# Patient Record
Sex: Female | Born: 1970
Health system: Southern US, Community
[De-identification: ages and names within clinical notes are randomized; demographics above are authoritative.]

## PROBLEM LIST (undated history)

## (undated) DIAGNOSIS — F1121 Opioid dependence, in remission: Secondary | ICD-10-CM

## (undated) DIAGNOSIS — F419 Anxiety disorder, unspecified: Secondary | ICD-10-CM

## (undated) DIAGNOSIS — R002 Palpitations: Secondary | ICD-10-CM

## (undated) DIAGNOSIS — F19980 Other psychoactive substance use, unspecified with psychoactive substance-induced anxiety disorder: Secondary | ICD-10-CM

## (undated) DIAGNOSIS — G4700 Insomnia, unspecified: Secondary | ICD-10-CM

## (undated) DIAGNOSIS — R569 Unspecified convulsions: Secondary | ICD-10-CM

## (undated) DIAGNOSIS — F1910 Other psychoactive substance abuse, uncomplicated: Secondary | ICD-10-CM

## (undated) DIAGNOSIS — Z6822 Body mass index (BMI) 22.0-22.9, adult: Secondary | ICD-10-CM

## (undated) DIAGNOSIS — Z87891 Personal history of nicotine dependence: Secondary | ICD-10-CM

## (undated) DIAGNOSIS — M1990 Unspecified osteoarthritis, unspecified site: Secondary | ICD-10-CM

## (undated) DIAGNOSIS — Z79891 Long term (current) use of opiate analgesic: Secondary | ICD-10-CM

## (undated) DIAGNOSIS — I10 Essential (primary) hypertension: Secondary | ICD-10-CM

## (undated) HISTORY — DX: Other psychoactive substance use, unspecified with psychoactive substance-induced anxiety disorder: F19.980

## (undated) HISTORY — DX: Opioid dependence, in remission: F11.21

## (undated) HISTORY — PX: TUBAL LIGATION: SHX77

## (undated) HISTORY — DX: Other psychoactive substance abuse, uncomplicated: F19.10

## (undated) HISTORY — DX: Long term (current) use of opiate analgesic: Z79.891

## (undated) HISTORY — DX: Insomnia, unspecified: G47.00

## (undated) HISTORY — DX: Body mass index (BMI) 22.0-22.9, adult: Z68.22

## (undated) HISTORY — DX: Personal history of nicotine dependence: Z87.891

## (undated) HISTORY — DX: Unspecified convulsions: R56.9

## (undated) HISTORY — DX: Unspecified osteoarthritis, unspecified site: M19.90

## (undated) HISTORY — DX: Palpitations: R00.2

## (undated) HISTORY — DX: Anxiety disorder, unspecified: F41.9

## (undated) HISTORY — DX: Essential (primary) hypertension: I10

---

## 1997-05-25 ENCOUNTER — Inpatient Hospital Stay (HOSPITAL_COMMUNITY): Admission: AD | Admit: 1997-05-25 | Discharge: 1997-07-04 | Payer: Self-pay | Admitting: Obstetrics & Gynecology

## 1997-07-20 ENCOUNTER — Encounter: Admission: RE | Admit: 1997-07-20 | Discharge: 1997-07-20 | Payer: Self-pay | Admitting: Family Medicine

## 1998-06-13 ENCOUNTER — Encounter: Payer: Self-pay | Admitting: *Deleted

## 1998-06-13 ENCOUNTER — Emergency Department (HOSPITAL_COMMUNITY): Admission: EM | Admit: 1998-06-13 | Discharge: 1998-06-13 | Payer: Self-pay | Admitting: *Deleted

## 1999-04-12 ENCOUNTER — Emergency Department (HOSPITAL_COMMUNITY): Admission: EM | Admit: 1999-04-12 | Discharge: 1999-04-12 | Payer: Self-pay | Admitting: Emergency Medicine

## 1999-07-14 ENCOUNTER — Emergency Department (HOSPITAL_COMMUNITY): Admission: EM | Admit: 1999-07-14 | Discharge: 1999-07-14 | Payer: Self-pay | Admitting: *Deleted

## 2000-11-25 ENCOUNTER — Emergency Department (HOSPITAL_COMMUNITY): Admission: EM | Admit: 2000-11-25 | Discharge: 2000-11-25 | Payer: Self-pay | Admitting: Emergency Medicine

## 2001-04-14 ENCOUNTER — Encounter: Payer: Self-pay | Admitting: Emergency Medicine

## 2001-04-14 ENCOUNTER — Emergency Department (HOSPITAL_COMMUNITY): Admission: EM | Admit: 2001-04-14 | Discharge: 2001-04-14 | Payer: Self-pay | Admitting: Emergency Medicine

## 2002-10-29 ENCOUNTER — Emergency Department (HOSPITAL_COMMUNITY): Admission: EM | Admit: 2002-10-29 | Discharge: 2002-10-29 | Payer: Self-pay | Admitting: Emergency Medicine

## 2003-10-22 ENCOUNTER — Emergency Department (HOSPITAL_COMMUNITY): Admission: EM | Admit: 2003-10-22 | Discharge: 2003-10-22 | Payer: Self-pay | Admitting: Emergency Medicine

## 2003-11-21 ENCOUNTER — Emergency Department (HOSPITAL_COMMUNITY): Admission: EM | Admit: 2003-11-21 | Discharge: 2003-11-22 | Payer: Self-pay | Admitting: Emergency Medicine

## 2015-04-12 ENCOUNTER — Emergency Department (HOSPITAL_COMMUNITY): Payer: Self-pay

## 2015-04-12 ENCOUNTER — Encounter (HOSPITAL_COMMUNITY): Payer: Self-pay | Admitting: Emergency Medicine

## 2015-04-12 ENCOUNTER — Emergency Department (HOSPITAL_COMMUNITY)
Admission: EM | Admit: 2015-04-12 | Discharge: 2015-04-12 | Disposition: A | Discharge status: Home / Self Care | Payer: Self-pay | Attending: Physician Assistant | Admitting: Physician Assistant

## 2015-04-12 DIAGNOSIS — R32 Unspecified urinary incontinence: Secondary | ICD-10-CM | POA: Insufficient documentation

## 2015-04-12 DIAGNOSIS — S32009A Unspecified fracture of unspecified lumbar vertebra, initial encounter for closed fracture: Secondary | ICD-10-CM

## 2015-04-12 DIAGNOSIS — M545 Low back pain, unspecified: Secondary | ICD-10-CM

## 2015-04-12 DIAGNOSIS — F17200 Nicotine dependence, unspecified, uncomplicated: Secondary | ICD-10-CM | POA: Insufficient documentation

## 2015-04-12 LAB — URINALYSIS, ROUTINE W REFLEX MICROSCOPIC
BILIRUBIN URINE: NEGATIVE
Glucose, UA: NEGATIVE mg/dL
HGB URINE DIPSTICK: NEGATIVE
Ketones, ur: NEGATIVE mg/dL
Leukocytes, UA: NEGATIVE
NITRITE: NEGATIVE
PROTEIN: NEGATIVE mg/dL
Specific Gravity, Urine: 1.042 — ABNORMAL HIGH (ref 1.005–1.030)
pH: 5 (ref 5.0–8.0)

## 2015-04-12 MED ORDER — LORAZEPAM 2 MG/ML IJ SOLN
1.0000 mg | Freq: Once | INTRAMUSCULAR | Status: AC | PRN
  Administered 2015-04-12: 1 mg via INTRAVENOUS
  Filled 2015-04-12: qty 1

## 2015-04-12 MED ORDER — KETOROLAC TROMETHAMINE 60 MG/2ML IM SOLN
60.0000 mg | Freq: Once | INTRAMUSCULAR | Status: AC
  Administered 2015-04-12: 60 mg via INTRAMUSCULAR
  Filled 2015-04-12: qty 2

## 2015-04-12 MED ORDER — METHOCARBAMOL 500 MG PO TABS: 500.0000 mg | ORAL_TABLET | Freq: Two times a day (BID) | ORAL | Status: DC

## 2015-04-12 MED ORDER — OXYCODONE-ACETAMINOPHEN 5-325 MG PO TABS: 1.0000 | ORAL_TABLET | ORAL | Status: DC | PRN

## 2015-04-12 MED ORDER — IBUPROFEN 800 MG PO TABS: 800.0000 mg | ORAL_TABLET | Freq: Three times a day (TID) | ORAL | Status: DC

## 2015-04-12 MED ORDER — OXYCODONE-ACETAMINOPHEN 5-325 MG PO TABS
1.0000 | ORAL_TABLET | Freq: Once | ORAL | Status: AC
  Administered 2015-04-12: 1 via ORAL
  Filled 2015-04-12: qty 1

## 2015-04-12 MED ORDER — METHOCARBAMOL 500 MG PO TABS
1000.0000 mg | ORAL_TABLET | Freq: Once | ORAL | Status: AC
  Administered 2015-04-12: 1000 mg via ORAL
  Filled 2015-04-12: qty 2

## 2015-04-12 MED ORDER — LIDOCAINE 5 % EX PTCH: 1.0000 | MEDICATED_PATCH | CUTANEOUS | Status: DC

## 2015-04-12 MED ORDER — GADOBENATE DIMEGLUMINE 529 MG/ML IV SOLN
15.0000 mL | Freq: Once | INTRAVENOUS | Status: AC | PRN
  Administered 2015-04-12: 11 mL via INTRAVENOUS

## 2015-04-12 NOTE — ED Notes (Signed)
Pt has taken off jewelry and bra. Belongings and jewelry given to husband.

## 2015-04-12 NOTE — ED Notes (Signed)
Per pt, states she was lifting a dog and heard something pop-was seen at [facility] and was diagnosed with a L 3 fracture

## 2015-04-12 NOTE — Discharge Instructions (Signed)
You have been seen today for back pain. Your imaging showed no abnormalities, including no fracture. Follow-up with orthopedics on this matter. Follow up with PCP as needed. Return to ED should symptoms worsen.   Emergency Department Resource Guide 1) Find a Doctor and Pay Out of Pocket Although you won't have to find out who is covered by your insurance plan, it is a good idea to ask around and get recommendations. You will then need to call the office and see if the doctor you have chosen will accept you as a new patient and what types of options they offer for patients who are self-pay. Some doctors offer discounts or will set up payment plans for their patients who do not have insurance, but you will need to ask so you aren't surprised when you get to your appointment.  2) Contact Your Local Health Department Not all health departments have doctors that can see patients for sick visits, but many do, so it is worth a call to see if yours does. If you don't know where your local health department is, you can check in your phone book. The CDC also has a tool to help you locate your state's health department, and many state websites also have listings of all of their local health departments.  3) Find a Walk-in Clinic If your illness is not likely to be very severe or complicated, you may want to try a walk in clinic. These are popping up all over the country in pharmacies, drugstores, and shopping centers. They're usually staffed by nurse practitioners or physician assistants that have been trained to treat common illnesses and complaints. They're usually fairly quick and inexpensive. However, if you have serious medical issues or chronic medical problems, these are probably not your best option.  No Primary Care Doctor: - Call Health Connect at  [phone] - they can help you locate a primary care doctor that  accepts your insurance, provides certain services, etc. - Physician Referral Service-  [phone]  Chronic Pain Problems: Organization         Address  Phone   Notes  [facility]  [phone] Patients need to be referred by their primary care doctor.   Medication Assistance: Organization         Address  Phone   Notes  [facility] Medication So [facility] [address] [place] [place], [place] [address] [phone] --Must be a resident of [facility] -- Must have NO insurance coverage whatsoever (no Medicaid/ Medicare, etc.) -- The pt. MUST have a primary care doctor that directs their care regularly and follows them in the community   [facility]ist  [phone]   [employer]  [phone]    Agencies that provide inexpensive medical care: Organization         Address  Phone   Notes  [facility]  [phone]   [facility]    [phone]   [facility] [address] [place], [place] [address] [phone]   [facility] [place] [address] [place] [place] [phone]   Planned Parenthood    [phone]   [facility]    [phone]   [facility]  [address], [place] Phone:  [phone], Fax:  [phone] Hours of Operation:  9 am - 6 pm, M-F.  Also accepts Medicaid/Medicare and self-pay.  [facility]  [address], [place] Phone: [phone], Fax: [phone]. Hours of Operation:  8:30 am - 5:30 pm, M-F.  Also accepts Medicaid and self-pay.  [facility] [place] [address] [place] Phone: [phone]   [place], [place], [place] [phone], Ext. [phone] Mondays & Thursdays: 7-9 AM.  First 15 patients are seen on a first come, first serve basis.    [place] Providers:  Organization         Address  Phone   Notes  [facility] [address],  [place] [phone] Also accepts self-pay patients.  [facility] [id number] G. L. Garcia, [place]  [phone]   [place], [address], [place] [phone]   [facility] [address] [place] [phone]   Lucianne Lei [address], [place]   [phone] Only accepts [place] Access [place] patients after they have their name applied to their card.   Self-Pay (no insurance) in [facility]:  Organization         Address  Phone   Notes  Sickle Cell Patients, [facility] Internal Medicine [place] [phone]   [facility] Urgent Care [place] [phone]   [facility] [place]  [place], [place], [place] [phone]   [facility]/Dr. Osei-Bonsu  [address] [place] or [address], [place] [phone] Phone number for both [place] and [place] locations is the same.  [facility] [address] [place] [phone]   [facility] [address] [place] or [address] [place] [address] [phone]   [facility] [address] [place] [phone], phone; [phone], fax Sees patients 1st and 3rd Saturday of every month.  Must not qualify for public or private insurance (i.e. Medicaid, Medicare, [place] Health Choice, Veterans' Benefits)  Household income should be no more than 200% of the poverty level The clinic cannot treat you if you are pregnant or think you are pregnant  Sexually transmitted diseases are not treated at the clinic.    Dental Care: Organization         Address  Phone  Notes  [facility] Department of [facility] [phone] Accepts children up to age 64 who are enrolled in  [place] or [place]; pregnant women with a Medicaid card; and children who have applied for Medicaid or [place] Health Choice, but were declined, whose parents can pay a reduced fee at time of service.  [facility] Department of [facility]  [address], [place] [phone] Accepts children up to age 59 who are enrolled in [place] or [place]; pregnant women with a Medicaid card; and children who have applied for Medicaid or [place] Health Choice, but were declined, whose parents can pay a reduced fee at time of service.  [facility]  [place] [phone] Patients are seen by appointment only. Walk-ins are not accepted. [place] will see patients 70 years of age and older. Monday - Tuesday (8am-5pm) Most Wednesdays (8:30-5pm) $30 per visit, cash only  [facility]  [address], [facility] [phone] Patients are seen by appointment only. Walk-ins are not accepted. [place] will see patients 55 years of age and older. One Wednesday Evening (Monthly: Volunteer Based).  $30 per visit, cash only  [place]  [phone] for adults; Children under age 16, call Graduate Pediatric Dentistry at [phone]. Children aged 15-14, please call [phone] to request a pediatric application.  Dental services are provided in all areas of dental care including fillings, crowns and bridges, complete and partial dentures, implants, gum treatment, root canals, and extractions. Preventive care is also provided. Treatment is provided to both adults and children. Patients are selected via a lottery and there is often a waiting list.   [facility] [address] [place]  [phone] [email or web address]   [facility] [address] [place], [place] [phone], Ext. [phone] Second and Fourth Thursday of each month, opens at 6:30  AM; Clinic ends at 9 AM.  Patients are seen on a first-come first-served basis, and a limited number are seen during each clinic.   [facility]  [address] Hillard Danker [place], [place] [phone]   Eligibility Requirements You must have lived in [place], [place], or [place] counties for at least the last three months.   You cannot be eligible for state or federal sponsored [employer], including [employer], [place], or [employer].   You generally cannot be eligible for healthcare insurance through your employer.    How to apply: Eligibility screenings are held every Tuesday and Wednesday afternoon from 1:00 pm until 4:00 pm. You do not need an appointment for the interview!  [facility] [address] [place], [place]   [employer]  [place] Department  [place]  [phone]    Behavioral Health Resources in the Community: Intensive Outpatient Programs Organization         Address  Phone  Notes  [place]. [address], [place], [place] [phone]   [facility] [address], [place], [employer]   ADS: Alcohol & Drug Svcs [address], [place], [place]   [place] [address]  [place], [place] or [phone]   Substance Abuse Resources Organization         Address  Phone  Notes  Alcohol and Drug Services  [phone]   [place]  [phone]   The [place]   Chinita Pester  [phone]   Residential & Outpatient Substance Abuse Program  [phone]   Psychological Services Organization         Address  Phone  Notes  [facility] [place]  [place]  [phone]   [place] [address] [place] [phone] or  [phone]    Mobile Crisis Teams Organization         Address  Phone  Notes  [facility], Mobile Crisis Care Unit  [phone]   [facility]  [address] [place], [place]   [place] [address] [place] [phone]    Self-Help/Support Groups Organization         Address  Phone             Notes  [facility]ental Health Assoc[facility] of [place] - variety of support groups  [place] Call for more information  Narcotics Anonymous (NA), Caring Services [address], [employer] [place]  2 meetings at this location   Special educational needs teacher         Address  Phone  Notes  [facility] [place],    [place]  [phone]   [facility]  [address], [place] [address], [place], [place]   [place], [place] [phone] Admissions: 8am-3pm M-F  Incentives Substance [place] [address],    [place], [place] [phone]   [facility] [address] [place], [place], [place]   [facility] [address],  [place], [place]   [facility] - Intensive Outpatient [address], [place], [place]   [facility] ([place].) [place].,  [place], [place] [phone] or [phone]   [facility] (RTS) [address] [place], [place] Accepts Medicaid  [facility] [place] [address]  [place] [phone] Substance Abuse/Addiction Treatment   [facility] Organization         Address  Phone  Notes  [facility]  [phone]   Domenic Schwab, PhD [address] Arlis Porta [place], [place]   [phone] or [phone]   [place] Percy [place], [place] [phone]   [facility] [address],  [place], [place] [phone] Insurance/Medicaid/sponsorship through [facility] and Families [address]                                    [place], [place] [phone] [place] [address], [place] [phone]    Dr. Adele Schilder  [phone]   [facility] Dept. 1) [address] [place] 2) [place] 3)  [place] [address], [place] [phone]  [phone]   [place] [phone] or [phone] (After Hours)

## 2015-04-12 NOTE — ED Provider Notes (Signed)
CSN: [id number]     Arrival date & time 04/12/15  1345 History  By signing my name below, I, Regina House, attest that this documentation has been prepared under the direction and in the presence of Kaulana Brindle C. Coutney Wildermuth, PA-C. Electronically Signed: Placido House, ED Scribe. 04/12/2015. 3:49 PM.    Chief Complaint  Patient presents with  . Back Pain   The history is provided by the patient. No language interpreter was used.    HPI Comments: Regina House is a 45 y.o. female who presents to the Emergency Department complaining of worsening, 7/10, lower back pain onset 4 days ago. Pt states that she was bending over to help move a dog and heard a "pop" in her lower back with her pain gradually worsening. Pt notes that she was [facility] ago, had x-rays performed, and was told "there was a dark spot over my L3 that could be a fracture or scoliosis and the top front corner could be broken." She was advised to follow up with [employer] and was discharged with only her x-rays on a CD. She reports 2x mild incontinence of her bladder which she states occurred after the incident and intermittent, sharp, severe, radiation of her pain down her bilateral legs that she describes as "a lightning bolt". Her pain is lowest when standing and worsens when bending. Pt notes an allergy to sulfa antibiotics. She denies n/v, incontinence of her bowels, neuro deficits, or any other associated symptoms at this time.    History reviewed. No pertinent past medical history. History reviewed. No pertinent past surgical history. No family history on file. Social History  Substance Use Topics  . Smoking status: Current Every Day Smoker  . Smokeless tobacco: None  . Alcohol Use: No   OB History    No data available     Review of Systems  Constitutional: Negative for fever and chills.  Respiratory: Negative for shortness of breath.   Gastrointestinal: Negative for nausea, vomiting and  abdominal pain.  Genitourinary:       2 distinct episodes of minor urinary incontinence.  Musculoskeletal: Positive for myalgias and back pain.  All other systems reviewed and are negative.   Allergies  Sulfa antibiotics  Home Medications   Prior to Admission medications   Medication Sig Start Date End Date Taking? Authorizing Provider  Aspirin-Salicylamide-Caffeine (BC HEADACHE POWDER PO) Take 1 Package by mouth daily as needed (pain).   Yes Historical Provider, MD  fluticasone (FLONASE) 50 MCG/ACT nasal spray Place 1 spray into both nostrils daily as needed for allergies or rhinitis.   Yes Historical Provider, MD  ibuprofen (ADVIL,MOTRIN) 800 MG tablet Take 1 tablet (800 mg total) by mouth 3 (three) times daily. 04/12/15   Hadriel Northup C Jerre Diguglielmo, PA-C  lidocaine (LIDODERM) 5 % Place 1 patch onto the skin daily. Remove & Discard patch within 12 hours or as directed by MD 04/12/15   Anselm Pancoast, PA-C  methocarbamol (ROBAXIN) 500 MG tablet Take 1 tablet (500 mg total) by mouth 2 (two) times daily. 04/12/15   Averlee Swartz C Benjamim Harnish, PA-C   BP 119/73 mmHg  Pulse 64  Temp(Src) 98.6 F (37 C) (Oral)  Resp 18  SpO2 98%    Physical Exam  Constitutional: She is oriented to person, place, and time. She appears well-developed and well-nourished.  HENT:  Head: Normocephalic and atraumatic.  Eyes: Conjunctivae are normal.  Neck: Normal range of motion. Neck supple.  Cardiovascular: Normal  rate and intact distal pulses.   Pulmonary/Chest: Effort normal. No respiratory distress.  Abdominal: Soft. There is no tenderness.  Musculoskeletal: She exhibits tenderness.  Midline spinal tenderness from L3-L5; right sided lumbar musculature tenderness  Neurological: She is alert and oriented to person, place, and time. She has normal reflexes.  No sensory deficits. Strength 5/5 in all extremities. Coordination intact. Hesitant short steps when ambulating due to pain  Skin: Skin is warm and dry.  Psychiatric: She has a  normal mood and affect.  Nursing note and vitals reviewed.   ED Course  Procedures  DIAGNOSTIC STUDIES: Oxygen Saturation is 100% on RA, normal by my interpretation.    COORDINATION OF CARE: 3:45 PM Discussed next steps with pt including reevaluation with attending physician. Pt verbalized understanding and is agreeable with the plan.   Labs Review Labs Reviewed  URINALYSIS, ROUTINE W REFLEX MICROSCOPIC (NOT AT [facility]) - Abnormal; Notable for the following:    Specific Gravity, Urine 1.042 (*)    All other components within normal limits    Imaging Review Mr Lumbar Spine W Wo Contrast  04/12/2015  CLINICAL DATA:  Lumbar vertebra fracture. Closed fracture initial encounter. L3 fracture on outside x-ray not available at this time. EXAM: MRI LUMBAR SPINE WITHOUT AND WITH CONTRAST TECHNIQUE: Multiplanar and multiecho pulse sequences of the lumbar spine were obtained without and with intravenous contrast. CONTRAST:  11mL MULTIHANCE GADOBENATE DIMEGLUMINE 529 MG/ML IV SOLN COMPARISON:  None. FINDINGS: Image quality degraded by motion Schmorl's node in the superior endplate of T12. Limbus vertebra superior endplate of L3 anteriorly without bone marrow edema or abnormal enhancement. These are chronic findings. No fracture or mass lesion. No pathologic enhancement. Conus medullaris normal and terminates L1-2 L1-2:  Negative L2-3: Mild disc degeneration and disc bulging without spinal stenosis L3-4: Mild disc bulging and early facet degeneration. No significant spinal or foraminal stenosis. L4-5: Mild disc bulging. This is asymmetric on the right. No significant spinal or foraminal stenosis. L5-S1:  Negative IMPRESSION: Negative for lumbar fracture. Limbus vertebra superior endplate of L3 may account for the radiographic finding. This does not appear to be associated with any bone marrow edema or abnormal enhancement . Mild lumbar degenerative change. Electronically Signed   By: Marlan Palau M.D.   On:  04/12/2015 17:39   I have personally reviewed and evaluated these images and lab results as part of my medical decision-making.   EKG Interpretation None      MDM   Final diagnoses:    Midline low back pain without sciatica    KAMMY KLETT presents with lower back pain that began four days ago.  Findings and plan of care discussed with Courteney Lyn Mackuen, MD.  This patient's story does have some abnormalities and seemingly inconsistent factors. The patient has a fully normal neuro exam, but her complaint of 2 episodes of urinary incontinence requires a rule out of cauda equina. In the meantime, the patient's pain was treated. Upon reassessment, the patient voiced complete pain relief with conservative management. Patient has no abnormalities on her UA. Received a call from the radiologist, who is stated that not only is there no fracture, there are no acute abnormalities. Patient will be treated symptomatically and follow-up with orthopedics.  Filed Vitals:   04/12/15 1418 04/12/15 1850  BP: 137/98 119/73  Pulse: 89 64  Temp: 98.3 F (36.8 C) 98.6 F (37 C)  TempSrc: Oral Oral  Resp: 16 18  SpO2: 100% 98%    I  personally performed the services described in this documentation, which was scribed in my presence. The recorded information has been reviewed and is accurate.    Anselm Pancoast, PA-C 04/12/15 1954  Courteney Randall An, MD 04/12/15 2246

## 2015-04-12 NOTE — ED Notes (Signed)
Pt has a ride home.  

## 2015-04-12 NOTE — ED Notes (Signed)
Patient transported to MRI 

## 2015-07-28 DIAGNOSIS — J0100 Acute maxillary sinusitis, unspecified: Secondary | ICD-10-CM | POA: Diagnosis not present

## 2015-07-28 DIAGNOSIS — R05 Cough: Secondary | ICD-10-CM | POA: Diagnosis not present

## 2015-07-28 DIAGNOSIS — R42 Dizziness and giddiness: Secondary | ICD-10-CM | POA: Diagnosis not present

## 2015-08-23 DIAGNOSIS — R0789 Other chest pain: Secondary | ICD-10-CM | POA: Diagnosis not present

## 2015-08-23 DIAGNOSIS — R002 Palpitations: Secondary | ICD-10-CM | POA: Diagnosis not present

## 2015-10-29 DIAGNOSIS — B029 Zoster without complications: Secondary | ICD-10-CM | POA: Diagnosis not present

## 2015-12-23 DIAGNOSIS — M79671 Pain in right foot: Secondary | ICD-10-CM | POA: Diagnosis not present

## 2015-12-26 DIAGNOSIS — J0100 Acute maxillary sinusitis, unspecified: Secondary | ICD-10-CM | POA: Diagnosis not present

## 2016-01-22 DIAGNOSIS — N3091 Cystitis, unspecified with hematuria: Secondary | ICD-10-CM | POA: Diagnosis not present

## 2016-01-22 DIAGNOSIS — N3001 Acute cystitis with hematuria: Secondary | ICD-10-CM | POA: Diagnosis not present

## 2016-02-28 DIAGNOSIS — Z79891 Long term (current) use of opiate analgesic: Secondary | ICD-10-CM | POA: Diagnosis not present

## 2016-03-13 DIAGNOSIS — Z79891 Long term (current) use of opiate analgesic: Secondary | ICD-10-CM | POA: Diagnosis not present

## 2016-03-26 DIAGNOSIS — Z79891 Long term (current) use of opiate analgesic: Secondary | ICD-10-CM | POA: Diagnosis not present

## 2016-04-09 DIAGNOSIS — Z79891 Long term (current) use of opiate analgesic: Secondary | ICD-10-CM | POA: Diagnosis not present

## 2016-04-15 DIAGNOSIS — J209 Acute bronchitis, unspecified: Secondary | ICD-10-CM | POA: Diagnosis not present

## 2016-04-19 DIAGNOSIS — N760 Acute vaginitis: Secondary | ICD-10-CM | POA: Diagnosis not present

## 2016-04-23 DIAGNOSIS — Z79891 Long term (current) use of opiate analgesic: Secondary | ICD-10-CM | POA: Diagnosis not present

## 2016-05-07 DIAGNOSIS — Z79891 Long term (current) use of opiate analgesic: Secondary | ICD-10-CM | POA: Diagnosis not present

## 2016-05-21 DIAGNOSIS — Z79891 Long term (current) use of opiate analgesic: Secondary | ICD-10-CM | POA: Diagnosis not present

## 2016-06-04 DIAGNOSIS — Z79891 Long term (current) use of opiate analgesic: Secondary | ICD-10-CM | POA: Diagnosis not present

## 2016-06-18 DIAGNOSIS — Z79891 Long term (current) use of opiate analgesic: Secondary | ICD-10-CM | POA: Diagnosis not present

## 2016-07-01 DIAGNOSIS — Z79891 Long term (current) use of opiate analgesic: Secondary | ICD-10-CM | POA: Diagnosis not present

## 2016-07-15 DIAGNOSIS — Z79891 Long term (current) use of opiate analgesic: Secondary | ICD-10-CM | POA: Diagnosis not present

## 2016-07-29 DIAGNOSIS — Z79891 Long term (current) use of opiate analgesic: Secondary | ICD-10-CM | POA: Diagnosis not present

## 2016-08-12 DIAGNOSIS — Z79891 Long term (current) use of opiate analgesic: Secondary | ICD-10-CM | POA: Diagnosis not present

## 2016-08-27 DIAGNOSIS — J0100 Acute maxillary sinusitis, unspecified: Secondary | ICD-10-CM | POA: Diagnosis not present

## 2016-08-31 DIAGNOSIS — Z79891 Long term (current) use of opiate analgesic: Secondary | ICD-10-CM | POA: Diagnosis not present

## 2016-09-14 DIAGNOSIS — Z79891 Long term (current) use of opiate analgesic: Secondary | ICD-10-CM | POA: Diagnosis not present

## 2016-09-28 DIAGNOSIS — Z79891 Long term (current) use of opiate analgesic: Secondary | ICD-10-CM | POA: Diagnosis not present

## 2016-10-20 DIAGNOSIS — Z79891 Long term (current) use of opiate analgesic: Secondary | ICD-10-CM | POA: Diagnosis not present

## 2016-11-03 DIAGNOSIS — Z79891 Long term (current) use of opiate analgesic: Secondary | ICD-10-CM | POA: Diagnosis not present

## 2016-11-16 DIAGNOSIS — Z79891 Long term (current) use of opiate analgesic: Secondary | ICD-10-CM | POA: Diagnosis not present

## 2016-11-30 DIAGNOSIS — Z79891 Long term (current) use of opiate analgesic: Secondary | ICD-10-CM | POA: Diagnosis not present

## 2016-12-14 DIAGNOSIS — Z79891 Long term (current) use of opiate analgesic: Secondary | ICD-10-CM | POA: Diagnosis not present

## 2016-12-28 DIAGNOSIS — Z79891 Long term (current) use of opiate analgesic: Secondary | ICD-10-CM | POA: Diagnosis not present

## 2017-01-11 DIAGNOSIS — Z79891 Long term (current) use of opiate analgesic: Secondary | ICD-10-CM | POA: Diagnosis not present

## 2017-01-26 DIAGNOSIS — Z79891 Long term (current) use of opiate analgesic: Secondary | ICD-10-CM | POA: Diagnosis not present

## 2017-02-09 DIAGNOSIS — Z79891 Long term (current) use of opiate analgesic: Secondary | ICD-10-CM | POA: Diagnosis not present

## 2017-02-10 DIAGNOSIS — J0100 Acute maxillary sinusitis, unspecified: Secondary | ICD-10-CM | POA: Diagnosis not present

## 2017-02-24 DIAGNOSIS — Z79891 Long term (current) use of opiate analgesic: Secondary | ICD-10-CM | POA: Diagnosis not present

## 2017-03-10 DIAGNOSIS — Z79891 Long term (current) use of opiate analgesic: Secondary | ICD-10-CM | POA: Diagnosis not present

## 2017-03-24 DIAGNOSIS — Z79891 Long term (current) use of opiate analgesic: Secondary | ICD-10-CM | POA: Diagnosis not present

## 2017-04-09 DIAGNOSIS — Z79891 Long term (current) use of opiate analgesic: Secondary | ICD-10-CM | POA: Diagnosis not present

## 2017-04-26 DIAGNOSIS — Z79891 Long term (current) use of opiate analgesic: Secondary | ICD-10-CM | POA: Diagnosis not present

## 2017-05-24 DIAGNOSIS — Z79891 Long term (current) use of opiate analgesic: Secondary | ICD-10-CM | POA: Diagnosis not present

## 2017-06-21 DIAGNOSIS — Z79891 Long term (current) use of opiate analgesic: Secondary | ICD-10-CM | POA: Diagnosis not present

## 2017-06-23 DIAGNOSIS — J0100 Acute maxillary sinusitis, unspecified: Secondary | ICD-10-CM | POA: Diagnosis not present

## 2017-09-06 DIAGNOSIS — Z79899 Other long term (current) drug therapy: Secondary | ICD-10-CM | POA: Diagnosis not present

## 2017-09-06 DIAGNOSIS — L404 Guttate psoriasis: Secondary | ICD-10-CM | POA: Diagnosis not present

## 2017-09-06 DIAGNOSIS — L409 Psoriasis, unspecified: Secondary | ICD-10-CM | POA: Diagnosis not present

## 2017-10-22 DIAGNOSIS — L84 Corns and callosities: Secondary | ICD-10-CM | POA: Diagnosis not present

## 2017-10-22 DIAGNOSIS — B070 Plantar wart: Secondary | ICD-10-CM | POA: Diagnosis not present

## 2017-10-22 DIAGNOSIS — L859 Epidermal thickening, unspecified: Secondary | ICD-10-CM | POA: Diagnosis not present

## 2017-10-22 DIAGNOSIS — R208 Other disturbances of skin sensation: Secondary | ICD-10-CM | POA: Diagnosis not present

## 2017-11-04 ENCOUNTER — Other Ambulatory Visit: Payer: Self-pay | Admitting: Student

## 2017-11-04 DIAGNOSIS — B182 Chronic viral hepatitis C: Secondary | ICD-10-CM

## 2017-11-08 DIAGNOSIS — Z79891 Long term (current) use of opiate analgesic: Secondary | ICD-10-CM | POA: Diagnosis not present

## 2017-11-12 ENCOUNTER — Ambulatory Visit
Admission: RE | Admit: 2017-11-12 | Discharge: 2017-11-12 | Disposition: A | Discharge status: Home / Self Care | Payer: BLUE CROSS/BLUE SHIELD | Source: Ambulatory Visit | Attending: Student | Admitting: Student

## 2017-11-12 DIAGNOSIS — B182 Chronic viral hepatitis C: Secondary | ICD-10-CM | POA: Insufficient documentation

## 2017-11-12 DIAGNOSIS — N281 Cyst of kidney, acquired: Secondary | ICD-10-CM | POA: Diagnosis not present

## 2017-11-16 ENCOUNTER — Other Ambulatory Visit: Payer: Self-pay | Admitting: Student

## 2017-11-16 DIAGNOSIS — B182 Chronic viral hepatitis C: Secondary | ICD-10-CM

## 2017-11-26 ENCOUNTER — Ambulatory Visit
Admission: RE | Admit: 2017-11-26 | Discharge: 2017-11-26 | Disposition: A | Discharge status: Home / Self Care | Payer: BLUE CROSS/BLUE SHIELD | Source: Ambulatory Visit | Attending: Student | Admitting: Student

## 2017-11-26 DIAGNOSIS — B182 Chronic viral hepatitis C: Secondary | ICD-10-CM | POA: Diagnosis not present

## 2017-11-26 DIAGNOSIS — R768 Other specified abnormal immunological findings in serum: Secondary | ICD-10-CM | POA: Diagnosis not present

## 2017-12-07 DIAGNOSIS — Z79891 Long term (current) use of opiate analgesic: Secondary | ICD-10-CM | POA: Diagnosis not present

## 2017-12-21 DIAGNOSIS — Z79891 Long term (current) use of opiate analgesic: Secondary | ICD-10-CM | POA: Diagnosis not present

## 2018-01-07 DIAGNOSIS — B182 Chronic viral hepatitis C: Secondary | ICD-10-CM | POA: Diagnosis not present

## 2018-01-14 DIAGNOSIS — B182 Chronic viral hepatitis C: Secondary | ICD-10-CM | POA: Diagnosis not present

## 2018-01-18 DIAGNOSIS — Z79891 Long term (current) use of opiate analgesic: Secondary | ICD-10-CM | POA: Diagnosis not present

## 2018-01-25 DIAGNOSIS — K740 Hepatic fibrosis: Secondary | ICD-10-CM | POA: Diagnosis not present

## 2018-01-25 DIAGNOSIS — R1013 Epigastric pain: Secondary | ICD-10-CM | POA: Diagnosis not present

## 2018-01-25 DIAGNOSIS — B182 Chronic viral hepatitis C: Secondary | ICD-10-CM | POA: Diagnosis not present

## 2018-02-11 DIAGNOSIS — B182 Chronic viral hepatitis C: Secondary | ICD-10-CM | POA: Diagnosis not present

## 2018-04-18 DIAGNOSIS — Z79891 Long term (current) use of opiate analgesic: Secondary | ICD-10-CM | POA: Diagnosis not present

## 2018-05-02 DIAGNOSIS — Z79891 Long term (current) use of opiate analgesic: Secondary | ICD-10-CM | POA: Diagnosis not present

## 2018-05-16 DIAGNOSIS — Z79891 Long term (current) use of opiate analgesic: Secondary | ICD-10-CM | POA: Diagnosis not present

## 2018-05-30 DIAGNOSIS — Z79891 Long term (current) use of opiate analgesic: Secondary | ICD-10-CM | POA: Diagnosis not present

## 2018-06-21 DIAGNOSIS — L259 Unspecified contact dermatitis, unspecified cause: Secondary | ICD-10-CM | POA: Diagnosis not present

## 2018-07-04 DIAGNOSIS — Z79891 Long term (current) use of opiate analgesic: Secondary | ICD-10-CM | POA: Diagnosis not present

## 2018-09-19 DIAGNOSIS — Z79891 Long term (current) use of opiate analgesic: Secondary | ICD-10-CM | POA: Diagnosis not present

## 2018-10-03 DIAGNOSIS — Z79891 Long term (current) use of opiate analgesic: Secondary | ICD-10-CM | POA: Diagnosis not present

## 2018-12-14 DIAGNOSIS — N958 Other specified menopausal and perimenopausal disorders: Secondary | ICD-10-CM | POA: Diagnosis not present

## 2019-02-13 DIAGNOSIS — Z79899 Other long term (current) drug therapy: Secondary | ICD-10-CM | POA: Diagnosis not present

## 2019-04-28 DIAGNOSIS — Z87891 Personal history of nicotine dependence: Secondary | ICD-10-CM | POA: Diagnosis not present

## 2019-05-12 DIAGNOSIS — Z87891 Personal history of nicotine dependence: Secondary | ICD-10-CM | POA: Diagnosis not present

## 2019-06-02 DIAGNOSIS — R300 Dysuria: Secondary | ICD-10-CM | POA: Diagnosis not present

## 2019-06-02 DIAGNOSIS — Z87891 Personal history of nicotine dependence: Secondary | ICD-10-CM | POA: Diagnosis not present

## 2019-09-19 DIAGNOSIS — R0789 Other chest pain: Secondary | ICD-10-CM | POA: Diagnosis not present

## 2019-09-19 DIAGNOSIS — R030 Elevated blood-pressure reading, without diagnosis of hypertension: Secondary | ICD-10-CM | POA: Diagnosis not present

## 2019-09-27 DIAGNOSIS — Z87891 Personal history of nicotine dependence: Secondary | ICD-10-CM | POA: Diagnosis not present

## 2019-09-27 DIAGNOSIS — Z79899 Other long term (current) drug therapy: Secondary | ICD-10-CM | POA: Diagnosis not present

## 2019-09-27 DIAGNOSIS — I10 Essential (primary) hypertension: Secondary | ICD-10-CM | POA: Diagnosis not present

## 2019-09-27 DIAGNOSIS — F19980 Other psychoactive substance use, unspecified with psychoactive substance-induced anxiety disorder: Secondary | ICD-10-CM | POA: Diagnosis not present

## 2019-09-27 DIAGNOSIS — R002 Palpitations: Secondary | ICD-10-CM | POA: Diagnosis not present

## 2019-10-11 ENCOUNTER — Other Ambulatory Visit: Payer: Self-pay

## 2019-10-11 DIAGNOSIS — R569 Unspecified convulsions: Secondary | ICD-10-CM | POA: Insufficient documentation

## 2019-10-11 DIAGNOSIS — M1990 Unspecified osteoarthritis, unspecified site: Secondary | ICD-10-CM | POA: Insufficient documentation

## 2019-10-11 DIAGNOSIS — I10 Essential (primary) hypertension: Secondary | ICD-10-CM | POA: Insufficient documentation

## 2019-10-11 DIAGNOSIS — Z87891 Personal history of nicotine dependence: Secondary | ICD-10-CM | POA: Insufficient documentation

## 2019-10-11 DIAGNOSIS — R002 Palpitations: Secondary | ICD-10-CM | POA: Insufficient documentation

## 2019-10-11 DIAGNOSIS — F1910 Other psychoactive substance abuse, uncomplicated: Secondary | ICD-10-CM | POA: Insufficient documentation

## 2019-10-11 DIAGNOSIS — F1121 Opioid dependence, in remission: Secondary | ICD-10-CM | POA: Insufficient documentation

## 2019-10-11 DIAGNOSIS — Z79891 Long term (current) use of opiate analgesic: Secondary | ICD-10-CM | POA: Insufficient documentation

## 2019-10-11 DIAGNOSIS — G4700 Insomnia, unspecified: Secondary | ICD-10-CM | POA: Insufficient documentation

## 2019-10-11 DIAGNOSIS — Z6822 Body mass index (BMI) 22.0-22.9, adult: Secondary | ICD-10-CM | POA: Insufficient documentation

## 2019-10-11 DIAGNOSIS — F19980 Other psychoactive substance use, unspecified with psychoactive substance-induced anxiety disorder: Secondary | ICD-10-CM | POA: Insufficient documentation

## 2019-10-11 DIAGNOSIS — F419 Anxiety disorder, unspecified: Secondary | ICD-10-CM | POA: Insufficient documentation

## 2019-10-16 NOTE — Progress Notes (Signed)
Cardiology Office Note:    Date:  10/17/2019   ID:  Regina House, DOB 02-07-1971, MRN [id number]  PCP:  Eunice Blase, PA-C  Cardiologist:  Norman Herrlich, MD   Referring MD: Eunice Blase, PA-C  ASSESSMENT:    1. Palpitations   2. Essential hypertension   3. Nonspecific abnormal electrocardiogram (ECG) (EKG)    PLAN:    In order of problems listed above:  1. Her primary problem is palpitation, 7-day ZIO monitor check thyroids as her physical appearance suggest thyroid excess. 2. Abnormal EKG check echocardiogram regarding hypertensive heart disease 3. Stage II hypertension add a second agent ARB check baseline labs including CBC thyroid renal function and a lipid profile  Next appointment 6 weeks  Medication Adjustments/Labs and Tests Ordered: Current medicines are reviewed at length with the patient today.  Concerns regarding medicines are outlined above.  Orders Placed This Encounter  Procedures  . CBC  . TSH+T4F+T3Free  . Lipid panel  . LONG TERM MONITOR (3-14 DAYS)  . EKG 12-Lead  . ECHOCARDIOGRAM COMPLETE   Meds ordered this encounter  Medications  . losartan (COZAAR) 50 MG tablet    Sig: Take 1 tablet (50 mg total) by mouth in the morning and at bedtime.    Dispense:  90 tablet    Refill:  3     Chief Complaint  Patient presents with  . Palpitations    History of Present Illness:    Regina House is a 49 y.o. female who is being seen today for the evaluation of palpitation at the request of O'Buch, Greta, PA-C.of attention She has a history of anxiety disorder elevated blood pressure and in the past at one time took an ACE inhibitor diuretic.   She has a long history of intermittent rapid heart rhythm but no precise diagnosis of arrhythmia.  Recently has worsened.  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  She had a particularly severe episode went to urgent care advised to go the emergency room and did not know EKG was performed.  Subsequently seen at by primary care she has not had lab work done but was started on clonidine for hypertension.  She has a long history of intermittent hypertension previously took a thiazide diuretic and ACE inhibitor.  When seen in the office today she is quite concerned that she has underlying heart disease her EKG shows LVH and right atrial enlargement.  Her blood pressure remains elevated.  She is not having exertional chest pain or syncope.  For further evaluation we will check routine labs including her thyroid and a lipid profile.  Should use a 7-day ZIO monitor to see if she is having atrial arrhythmia particularly an echocardiogram for hypertensive heart disease or cardiomyopathy.  We discussed additional therapy and I will place her on a low-dose ARB. Past Medical History:  Diagnosis Date  . Anxiety   . Arthritis   . BMI 22.0-22.9, adult   . Chronic use of opiate drug for therapeutic purpose   . History of tobacco use   . Hypertension   . Insomnia   . Palpitations   . Polysubstance abuse (HCC)   . Seizure (HCC)   . Severe opioid dependence in sustained remission on maintenance therapy (HCC)   . Substance-induced anxiety disorder (HCC)  Past Surgical History:  Procedure Laterality Date  . CESAREAN SECTION  1999  . TUBAL LIGATION      Current Medications: Current Meds  Medication Sig  . Buprenorphine HCl-Naloxone HCl 8-2 MG FILM Place 2 Film under the tongue daily.  . cloNIDine (CATAPRES) 0.1 MG tablet Take 0.1 mg by mouth daily as needed.  . Multiple Vitamin (MULTIVITAMIN) tablet Take 1 tablet by mouth daily.  . nitroGLYCERIN (NITROSTAT) 0.4 MG SL tablet Place 0.4 mg under the tongue as directed.     Allergies:   Sulfa antibiotics   Social History   Socioeconomic History  . Marital status: Married    Spouse name:  Not on file  . Number of children: Not on file  . Years of education: Not on file  . Highest education level: Not on file  Occupational History  . Not on file  Tobacco Use  . Smoking status: Current Every Day Smoker  . Smokeless tobacco: Current User  Substance and Sexual Activity  . Alcohol use: No  . Drug use: Not on file  . Sexual activity: Not on file  Other Topics Concern  . Not on file  Social History Narrative  . Not on file   Social Determinants of Health   Financial Resource Strain:   . Difficulty of Paying Living Expenses: Not on file  Food Insecurity:   . Worried About Programme researcher, broadcasting/film/video in the Last Year: Not on file  . Ran Out of Food in the Last Year: Not on file  Transportation Needs:   . Lack of Transportation (Medical): Not on file  . Lack of Transportation (Non-Medical): Not on file  Physical Activity:   . Days of Exercise per Week: Not on file  . Minutes of Exercise per Session: Not on file  Stress:   . Feeling of Stress : Not on file  Social Connections:   . Frequency of Communication with Friends and Family: Not on file  . Frequency of Social Gatherings with Friends and Family: Not on file  . Attends Religious Services: Not on file  . Active Member of Clubs or Organizations: Not on file  . Attends Banker Meetings: Not on file  . Marital Status: Not on file     Family History: The patient's family history includes Arthritis in her maternal grandfather, maternal grandmother, and mother; Heart attack in her father and paternal grandfather; Heart disease in her father and maternal grandmother; Hyperlipidemia in her father and maternal grandfather; Hypertension in her father, maternal grandfather, maternal grandmother, mother, paternal grandfather, and paternal grandmother; Liver cancer in her maternal grandfather; Stroke in her maternal grandmother, paternal grandfather, and paternal grandmother.  ROS:   Review of Systems  Constitutional:  Positive for weight gain.  HENT: Negative.   Eyes: Negative.   Cardiovascular: Positive for chest pain and palpitations.  Respiratory: Negative.   Endocrine: Negative.   Hematologic/Lymphatic: Negative.   Skin: Positive for rash.  Musculoskeletal: Negative.   Gastrointestinal: Negative.   Genitourinary: Negative.   Neurological: Negative.   Psychiatric/Behavioral: Negative.   Allergic/Immunologic: Negative.    Please see the history of present illness.     All other systems reviewed and are negative.  EKGs/Labs/Other Studies Reviewed:    The following studies were reviewed today:   EKG:  EKG is  ordered today.  The ekg ordered today is personally reviewed and demonstrates sinus rhythm right atrial enlargement LVH voltage  Recent Labs: No results found for requested labs within last  8760 hours.  Recent Lipid Panel No results found for: CHOL, TRIG, HDL, CHOLHDL, VLDL, LDLCALC, LDLDIRECT  Physical Exam:    VS:  BP (!) 160/94   Pulse 74   Ht 5\' 4"  (1.626 m)   Wt 130 lb 12.8 oz (59.3 kg)   SpO2 96%   BMI 22.45 kg/m     Wt Readings from Last 3 Encounters:  10/17/19 130 lb 12.8 oz (59.3 kg)     GEN: Anxious she has a thyroid stare and a fine tremor.  Well nourished, well developed in no acute distress HEENT: Normal NECK: No JVD; No carotid bruits LYMPHATICS: No lymphadenopathy CARDIAC: RRR, no murmurs, rubs, gallops RESPIRATORY:  Clear to auscultation without rales, wheezing or rhonchi  ABDOMEN: Soft, non-tender, non-distended MUSCULOSKELETAL:  No edema; No deformity  SKIN: Warm and dry NEUROLOGIC:  Alert and oriented x 3 PSYCHIATRIC:  Normal affect     Signed, 10/19/19, MD  10/17/2019 10:32 AM     [facility]

## 2019-10-17 ENCOUNTER — Encounter: Payer: Self-pay | Admitting: Cardiology

## 2019-10-17 ENCOUNTER — Other Ambulatory Visit: Payer: Self-pay

## 2019-10-17 ENCOUNTER — Ambulatory Visit: Payer: BC Managed Care – PPO | Admitting: Cardiology

## 2019-10-17 ENCOUNTER — Ambulatory Visit (INDEPENDENT_AMBULATORY_CARE_PROVIDER_SITE_OTHER): Payer: BC Managed Care – PPO

## 2019-10-17 VITALS — BP 160/94 | HR 74 | Ht 64.0 in | Wt 130.8 lb

## 2019-10-17 DIAGNOSIS — R9431 Abnormal electrocardiogram [ECG] [EKG]: Secondary | ICD-10-CM

## 2019-10-17 DIAGNOSIS — R002 Palpitations: Secondary | ICD-10-CM | POA: Diagnosis not present

## 2019-10-17 DIAGNOSIS — I10 Essential (primary) hypertension: Secondary | ICD-10-CM

## 2019-10-17 MED ORDER — LOSARTAN POTASSIUM 50 MG PO TABS: 50.0000 mg | ORAL_TABLET | Freq: Two times a day (BID) | ORAL | 3 refills | Status: DC

## 2019-10-17 NOTE — Patient Instructions (Addendum)
Medication Instructions:  Your physician has recommended you make the following change in your medication:  START: Cozaar 50 mg take one tablet by mouth twice daily.  *If you need a refill on your cardiac medications before your next appointment, please call your pharmacy*   Lab Work: Your physician recommends that you return for lab work in: TODAY CBC, TSH,T3,T4, Lipids If you have labs (blood work) drawn today and your tests are completely normal, you will receive your results only by: Marland Kitchen MyChart Message (if you have MyChart) OR . A paper copy in the mail If you have any lab test that is abnormal or we need to change your treatment, we will call you to review the results.   Testing/Procedures: Your physician has requested that you have an echocardiogram. Echocardiography is a painless test that uses sound waves to create images of your heart. It provides your doctor with information about the size and shape of your heart and how well your heart's chambers and valves are working. This procedure takes approximately one hour. There are no restrictions for this procedure.  A zio monitor was ordered today. It will remain on for 7 days. You will then return monitor and event diary in provided box. It takes 1-2 weeks for report to be downloaded and returned to [place]. We will call you with the results. If monitor falls off or has orange flashing light, please call Zio for further instructions.      Follow-Up: At [facility], you and your health needs are our priority.  As part of our continuing mission to provide you with exceptional heart care, we have created designated Provider Care Teams.  These Care Teams include your primary Cardiologist (physician) and Advanced Practice Providers (APPs -  Physician Assistants and Nurse Practitioners) who all work together to provide you with the care you need, when you need it.  We recommend signing up for the patient portal called "MyChart".  Sign up  information is provided on this After Visit Summary.  MyChart is used to connect with patients for Virtual Visits (Telemedicine).  Patients are able to view lab/test results, encounter notes, upcoming appointments, etc.  Non-urgent messages can be sent to your provider as well.   To learn more about what you can do with MyChart, go to [email or web address].    Your next appointment:   4 week(s)  The format for your next appointment:   In Person  Provider:   Norman Herrlich, MD   Other Instructions  Please check your blood pressure daily at home and keep a record of this.   1. Avoid all over-the-counter antihistamines except Claritin/Loratadine and Zyrtec/Cetrizine. 2. Avoid all combination including cold sinus allergies flu decongestant and sleep medications 3. You can use Robitussin DM Mucinex and Mucinex DM for cough. 4. can use Tylenol aspirin ibuprofen and naproxen but no combinations such as sleep or sinus.

## 2019-10-18 ENCOUNTER — Telehealth: Payer: Self-pay

## 2019-10-18 LAB — LIPID PANEL
Chol/HDL Ratio: 4 ratio (ref 0.0–4.4)
Cholesterol, Total: 172 mg/dL (ref 100–199)
HDL: 43 mg/dL (ref >39)
LDL Chol Calc (NIH): 114 mg/dL — ABNORMAL HIGH (ref 0–99)
Triglycerides: 81 mg/dL (ref 0–149)
VLDL Cholesterol Cal: 15 mg/dL (ref 5–40)

## 2019-10-18 LAB — CBC
Hematocrit: 43.8 % (ref 34.0–46.6)
Hemoglobin: 14.7 g/dL (ref 11.1–15.9)
MCH: 31.5 pg (ref 26.6–33.0)
MCHC: 33.6 g/dL (ref 31.5–35.7)
MCV: 94 fL (ref 79–97)
Platelets: 143 10*3/uL — ABNORMAL LOW (ref 150–450)
RBC: 4.67 x10E6/uL (ref 3.77–5.28)
RDW: 12.7 % (ref 11.7–15.4)
WBC: 5 10*3/uL (ref 3.4–10.8)

## 2019-10-18 LAB — TSH+T4F+T3FREE
Free T4: 1.13 ng/dL (ref 0.82–1.77)
T3, Free: 3.3 pg/mL (ref 2.0–4.4)
TSH: 2.08 u[IU]/mL (ref 0.450–4.500)

## 2019-10-18 NOTE — Telephone Encounter (Signed)
Patient is returning call regarding lab results. Please call back.

## 2019-10-18 NOTE — Telephone Encounter (Signed)
-----   Message from Baldo Daub, MD sent at 10/18/2019  8:02 AM EDT ----- Good results normal hemoglobin thyroid and her lipid profile is good.

## 2019-10-18 NOTE — Telephone Encounter (Signed)
Spoke with patient regarding results and recommendation.  Patient verbalizes understanding and is agreeable to plan of care. Advised patient to call back with any issues or concerns.  

## 2019-10-18 NOTE — Telephone Encounter (Signed)
Left message on patients voicemail to please return our call.   

## 2019-10-27 ENCOUNTER — Telehealth: Payer: Self-pay | Admitting: Cardiology

## 2019-10-27 DIAGNOSIS — R002 Palpitations: Secondary | ICD-10-CM | POA: Diagnosis not present

## 2019-10-27 NOTE — Telephone Encounter (Signed)
Probably best to stop losartan  I would direct her back to her PCP who treats her hypertension.

## 2019-10-27 NOTE — Telephone Encounter (Signed)
Spoke to the patient just now and let her know Dr. Munley's recommendations. She verbalizes understanding and thanks me for the call back.  

## 2019-10-27 NOTE — Telephone Encounter (Signed)
Pt c/o medication issue: 1. Name of Medication: Losartan 50 mg 2. How are you currently taking this medication (dosage and times per day)? 2 times a day just started medication about week ago 3. Are you having a reaction (difficulty breathing--STAT)?  No  4. What is your medication issue? Patient head and she is cramping very bad at night.

## 2019-11-08 ENCOUNTER — Ambulatory Visit (INDEPENDENT_AMBULATORY_CARE_PROVIDER_SITE_OTHER): Payer: BC Managed Care – PPO

## 2019-11-08 ENCOUNTER — Other Ambulatory Visit: Payer: Self-pay

## 2019-11-08 ENCOUNTER — Telehealth: Payer: Self-pay

## 2019-11-08 DIAGNOSIS — I10 Essential (primary) hypertension: Secondary | ICD-10-CM

## 2019-11-08 DIAGNOSIS — R002 Palpitations: Secondary | ICD-10-CM

## 2019-11-08 LAB — ECHOCARDIOGRAM COMPLETE
Area-P 1/2: 3.17 cm2
S' Lateral: 2.9 cm

## 2019-11-08 NOTE — Telephone Encounter (Signed)
Left message on patients voicemail to please return our call.   

## 2019-11-08 NOTE — Progress Notes (Signed)
Complete echocardiogram performed.  Jimmy Lawana Hartzell RDCS, RVT  

## 2019-11-08 NOTE — Telephone Encounter (Signed)
-----   Message from Baldo Daub, MD sent at 11/08/2019  8:43 AM EDT ----- Good result on her monitor no concerning problem with heart rhythm

## 2019-11-09 ENCOUNTER — Telehealth: Payer: Self-pay

## 2019-11-09 NOTE — Telephone Encounter (Signed)
Spoke with patient regarding results and recommendation.  Patient verbalizes understanding and is agreeable to plan of care. Advised patient to call back with any issues or concerns.  

## 2019-11-09 NOTE — Telephone Encounter (Signed)
-----   Message from Baldo Daub, MD sent at 11/08/2019  6:00 PM EDT ----- Normal echo

## 2019-11-09 NOTE — Telephone Encounter (Signed)
-----   Message from Brian J Munley, MD sent at 11/08/2019  6:00 PM EDT ----- Normal echo 

## 2019-11-09 NOTE — Telephone Encounter (Signed)
Left message on patients voicemail to please return our call.   

## 2019-11-13 NOTE — Progress Notes (Signed)
Cardiology Office Note:    Date:  11/14/2019   ID:  [name], DOB 1970-03-02, MRN [id number]  PCP:  Regina Blase, PA-C  Cardiologist:  Regina Herrlich, MD    Referring MD: Regina Blase, PA-C    ASSESSMENT:    1. Palpitations   2. Essential hypertension    PLAN:    In order of problems listed above:  1. Improved symptoms resolved no arrhythmia monitoring echocardiogram normal 2. Poorly controlled intolerant of ACE ARB and thiazide diuretic.  I think she will do well with and will start Inspra and follow-up as described   Next appointment: 6 weeks   Medication Adjustments/Labs and Tests Ordered: Current medicines are reviewed at length with the patient today.  Concerns regarding medicines are outlined above.  No orders of the defined types were placed in this encounter.  No orders of the defined types were placed in this encounter.   Chief Complaint  Patient presents with   Follow-up    History of Present Illness:    Regina House is a 49 y.o. female with a hx of hypertension and palpitations last seen 10/17/2019. Initiated on antihypertensive therapy with an ARB. Echocardiogram performed after the visit showed ejection fraction 60 to 65% with mild concentric LVH otherwise normal. Her 7-day ambulatory ZIO monitor showed rare ventricular and supraventricular ectopy. Compliance with diet, lifestyle and medications: Yes  Unfortunately she has been intolerant to ACE inhibitor diuretic and ARB with predominantly muscle pain but also has had migrainous headache.  Her blood pressure remains elevated rechecked by me 156/90 she is sodium restricted and she is an ideal weight after discussion of alternatives and we will put her on distal diuretic Inspra 2 weeks check renal function have her check her blood pressure twice a day at home and record and reassess in the office in 6 weeks if at target I will send back to her PCP for antihypertensive treatment.  She is reassured by the  results of her cardiac testing.  Recent lipid profile shows a cholesterol 172 LDL 114 triglycerides 81 HDL 43.  At this time I would not put on lipid-lowering treatment.  She is having no chest pain shortness of breath palpitation or syncope Past Medical History:  Diagnosis Date   Anxiety    Arthritis    BMI 22.0-22.9, adult    Chronic use of opiate drug for therapeutic purpose    History of tobacco use    Hypertension    Insomnia    Palpitations    Polysubstance abuse (HCC)    Seizure (HCC)    Severe opioid dependence in sustained remission on maintenance therapy (HCC)    Substance-induced anxiety disorder (HCC)     Past Surgical History:  Procedure Laterality Date   CESAREAN SECTION  1999   TUBAL LIGATION      Current Medications: Current Meds  Medication Sig   Buprenorphine HCl-Naloxone HCl 8-2 MG FILM Place 2 Film under the tongue daily.   cloNIDine (CATAPRES) 0.1 MG tablet Take 0.1 mg by mouth daily as needed.   Multiple Vitamin (MULTIVITAMIN) tablet Take 1 tablet by mouth daily.   nitroGLYCERIN (NITROSTAT) 0.4 MG SL tablet Place 0.4 mg under the tongue as directed.     Allergies:   Losartan and Sulfa antibiotics   Social History   Socioeconomic History   Marital status: Married    Spouse name: Not on file   Number of children: Not on file   Years of education: Not on  file   Highest education level: Not on file  Occupational History   Not on file  Tobacco Use   Smoking status: Current Every Day Smoker   Smokeless tobacco: Current User  Substance and Sexual Activity   Alcohol use: No   Drug use: Not on file   Sexual activity: Not on file  Other Topics Concern   Not on file  Social History Narrative   Not on file   Social Determinants of Health   Financial Resource Strain:    Difficulty of Paying Living Expenses: Not on file  Food Insecurity:    Worried About Running Out of Food in the Last Year: Not on file   Ran Out  of Food in the Last Year: Not on file  Transportation Needs:    Lack of Transportation (Medical): Not on file   Lack of Transportation (Non-Medical): Not on file  Physical Activity:    Days of Exercise per Week: Not on file   Minutes of Exercise per Session: Not on file  Stress:    Feeling of Stress : Not on file  Social Connections:    Frequency of Communication with Friends and Family: Not on file   Frequency of Social Gatherings with Friends and Family: Not on file   Attends Religious Services: Not on file   Active Member of Clubs or Organizations: Not on file   Attends Banker Meetings: Not on file   Marital Status: Not on file     Family History: The patient's family history includes Arthritis in her maternal grandfather, maternal grandmother, and mother; Heart attack in her father and paternal grandfather; Heart disease in her father and maternal grandmother; Hyperlipidemia in her father and maternal grandfather; Hypertension in her father, maternal grandfather, maternal grandmother, mother, paternal grandfather, and paternal grandmother; Liver cancer in her maternal grandfather; Stroke in her maternal grandmother, paternal grandfather, and paternal grandmother. ROS:   Please see the history of present illness.    All other systems reviewed and are negative.  EKGs/Labs/Other Studies Reviewed:    The following studies were reviewed today:  Recent Labs: 10/17/2019: Hemoglobin 14.7; Platelets 143; TSH 2.080  Recent Lipid Panel    Component Value Date/Time   CHOL 172 10/17/2019 1047   TRIG 81 10/17/2019 1047   HDL 43 10/17/2019 1047   CHOLHDL 4.0 10/17/2019 1047   LDLCALC 114 (H) 10/17/2019 1047    Physical Exam:    VS:  BP (!) 149/82    Pulse 68    Ht 5\' 4"  (1.626 m)    Wt 128 lb 6.4 oz (58.2 kg)    SpO2 99%    BMI 22.04 kg/m     Wt Readings from Last 3 Encounters:  11/14/19 128 lb 6.4 oz (58.2 kg)  10/17/19 130 lb 12.8 oz (59.3 kg)     GEN:   Well nourished, well developed in no acute distress HEENT: Normal NECK: No JVD; No carotid bruits LYMPHATICS: No lymphadenopathy CARDIAC: RRR, no murmurs, rubs, gallops RESPIRATORY:  Clear to auscultation without rales, wheezing or rhonchi  ABDOMEN: Soft, non-tender, non-distended MUSCULOSKELETAL:  No edema; No deformity  SKIN: Warm and dry NEUROLOGIC:  Alert and oriented x 3 PSYCHIATRIC:  Normal affect    Signed, 10/19/19, MD  11/14/2019 8:43 AM    [facility]

## 2019-11-14 ENCOUNTER — Ambulatory Visit: Payer: BC Managed Care – PPO | Admitting: Cardiology

## 2019-11-14 ENCOUNTER — Encounter: Payer: Self-pay | Admitting: Cardiology

## 2019-11-14 ENCOUNTER — Other Ambulatory Visit: Payer: Self-pay

## 2019-11-14 VITALS — BP 149/82 | HR 68 | Ht 64.0 in | Wt 128.4 lb

## 2019-11-14 DIAGNOSIS — I10 Essential (primary) hypertension: Secondary | ICD-10-CM | POA: Diagnosis not present

## 2019-11-14 DIAGNOSIS — R002 Palpitations: Secondary | ICD-10-CM

## 2019-11-14 MED ORDER — EPLERENONE 25 MG PO TABS: 25.0000 mg | ORAL_TABLET | Freq: Every day | ORAL | 3 refills | Status: AC

## 2019-11-14 NOTE — Patient Instructions (Addendum)
Medication Instructions:  Start: Eplerenone (Inspra) 25 mg daily   *If you need a refill on your cardiac medications before your next appointment, please call your pharmacy*   Lab Work: Your physician recommends that you return for lab work in: 2 weeks, No appointment needed  If you have labs (blood work) drawn today and your tests are completely normal, you will receive your results only by: Marland Kitchen MyChart Message (if you have MyChart) OR . A paper copy in the mail If you have any lab test that is abnormal or we need to change your treatment, we will call you to review the results.   Testing/Procedures: None ordered    Follow-Up: At [facility], you and your health needs are our priority.  As part of our continuing mission to provide you with exceptional heart care, we have created designated Provider Care Teams.  These Care Teams include your primary Cardiologist (physician) and Advanced Practice Providers (APPs -  Physician Assistants and Nurse Practitioners) who all work together to provide you with the care you need, when you need it.  We recommend signing up for the patient portal called "MyChart".  Sign up information is provided on this After Visit Summary.  MyChart is used to connect with patients for Virtual Visits (Telemedicine).  Patients are able to view lab/test results, encounter notes, upcoming appointments, etc.  Non-urgent messages can be sent to your provider as well.   To learn more about what you can do with MyChart, go to [email or web address].    Your next appointment:   6 week(s)  The format for your next appointment:   In Person  Provider:   Norman Herrlich, MD   Other Instructions Please check your blood pressure twice a day for 2 weeks and drop off readings when you come in for labs

## 2019-12-04 IMAGING — US US ABDOMEN COMPLETE
1 series · 14 of 25 positions shown · non-contrast
Comparison: None.

CLINICAL DATA: Chronic hepatitis-C

EXAM:
ABDOMEN ULTRASOUND COMPLETE

[Series 1: us abdomen complete · 14 of 116 slices shown]
[im 1/116]
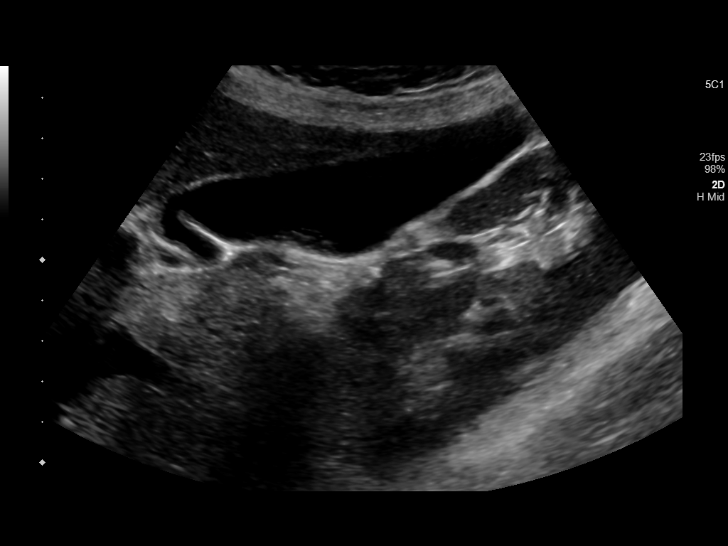
[im 10/116]
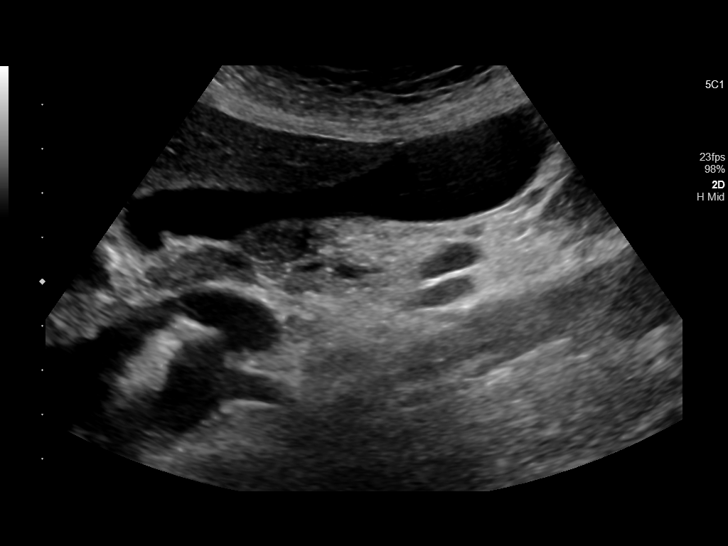
[im 20/116]
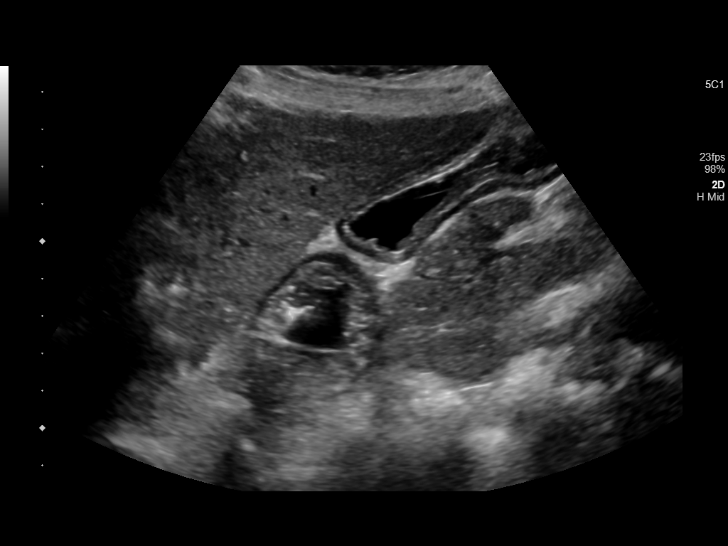
[im 29/116]
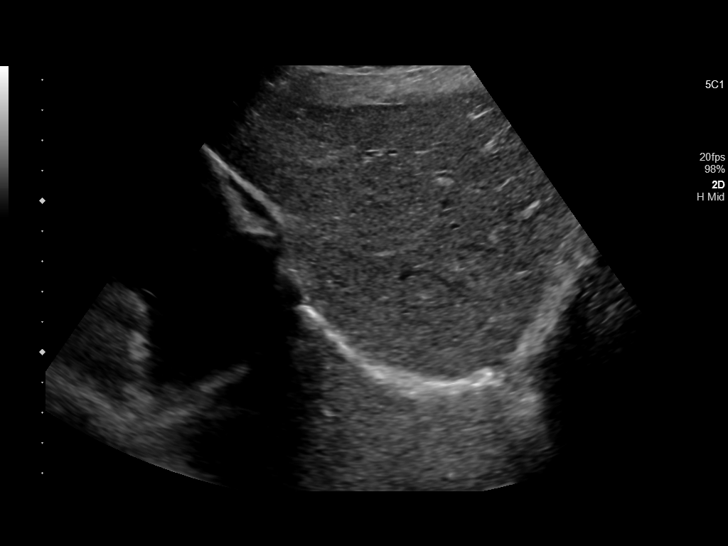
[im 39/116]
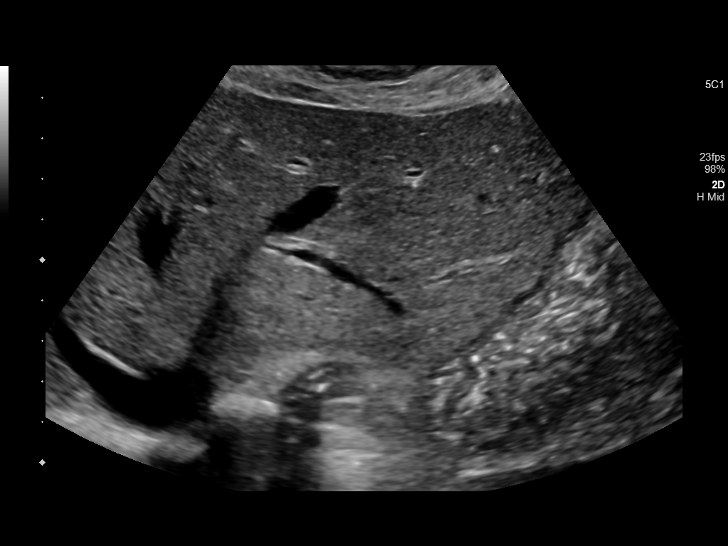
[im 44/116]
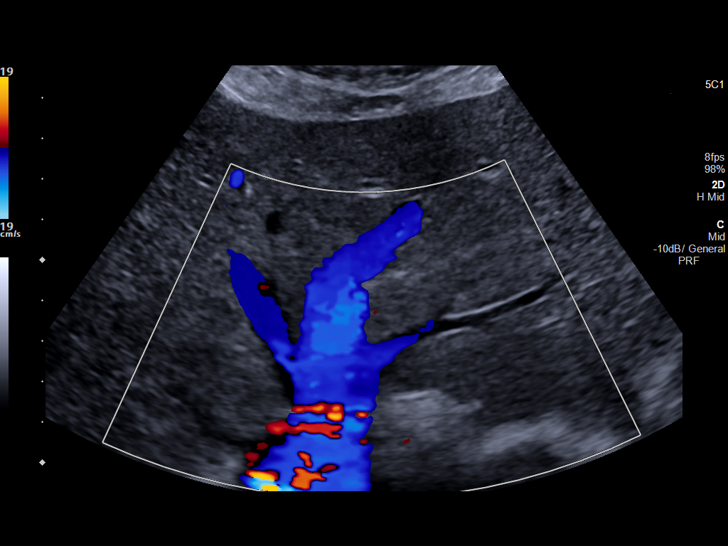
[im 53/116]
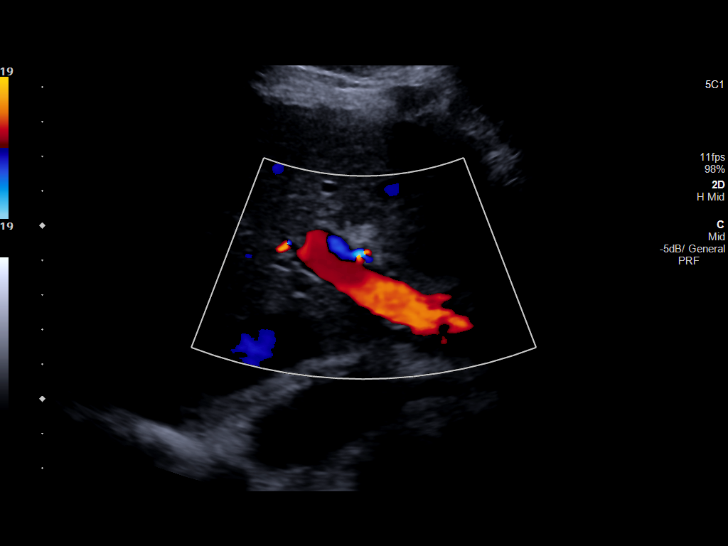
[im 63/116]
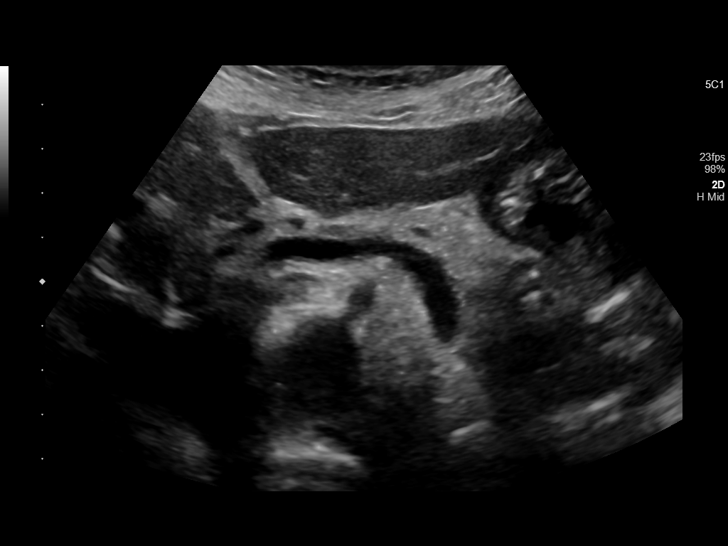
[im 72/116]
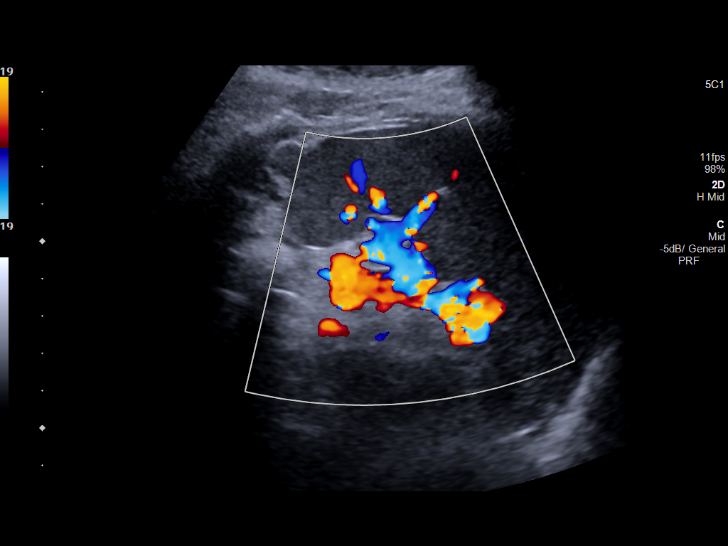
[im 77/116]
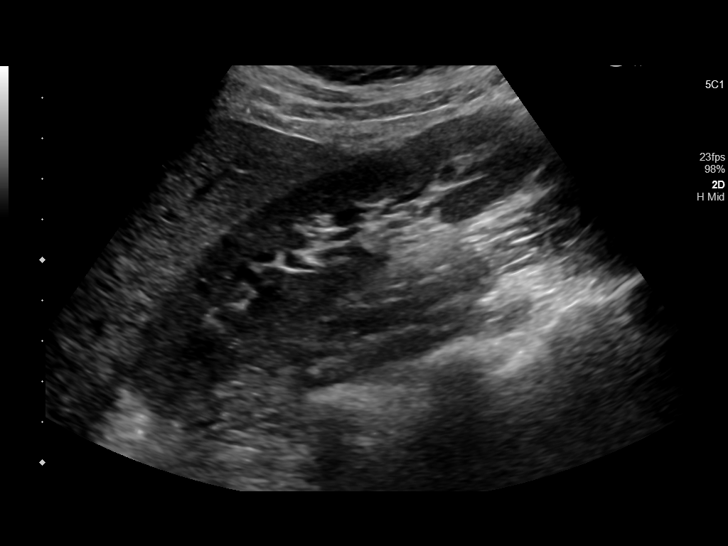
[im 87/116]
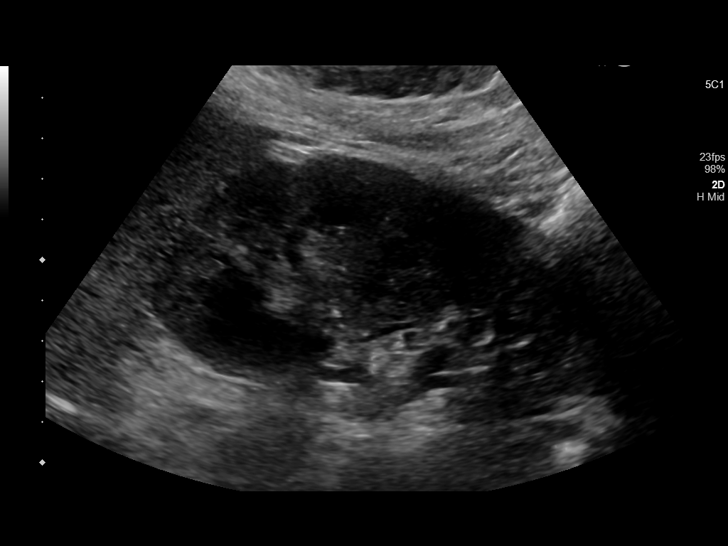
[im 96/116]
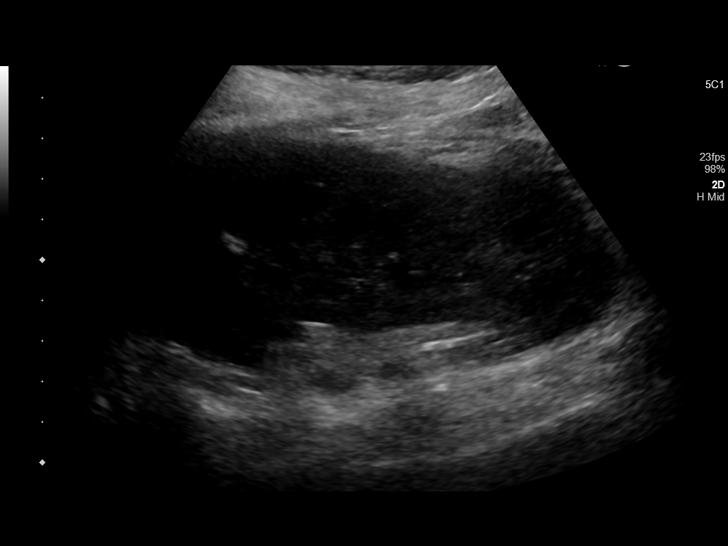
[im 106/116]
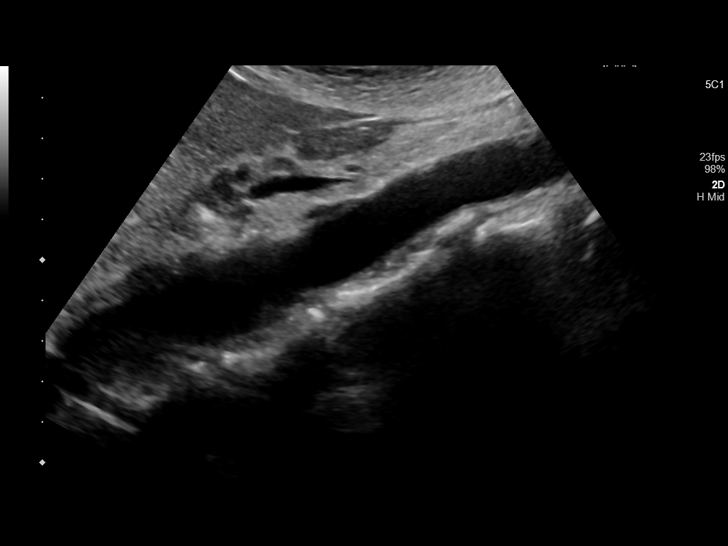
[im 116/116]
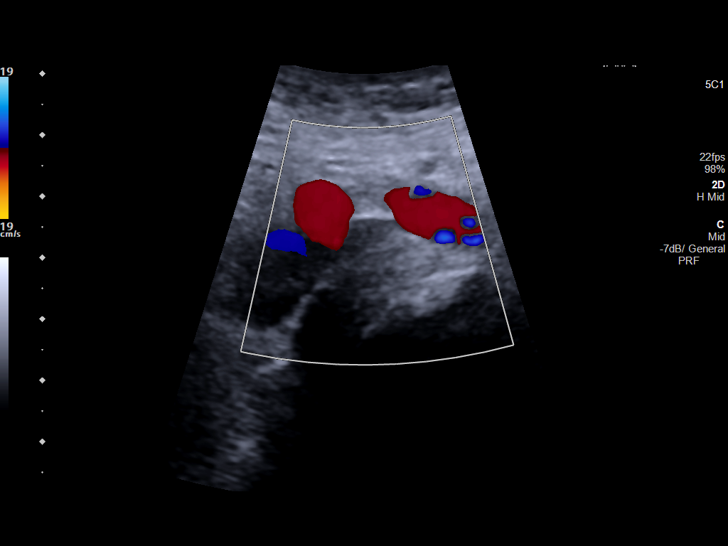

[14 of 25 positions shown; findings below may reference images not displayed]

FINDINGS: Gallbladder: No gallstones or wall thickening visualized. No
sonographic Murphy sign noted by sonographer.

Common bile duct: Diameter: 2.6 mm

Liver: No focal lesion identified. Within normal limits in
parenchymal echogenicity. Portal vein is patent on color Doppler
imaging with normal direction of blood flow towards the liver.

IVC: No abnormality visualized.

Pancreas: Visualized portion unremarkable.

Spleen: Size and appearance within normal limits.

Right Kidney: Length: 12.1 cm. Echogenicity within normal limits. No
mass or hydronephrosis visualized.

Left Kidney: Length: 11.5 cm. No mass or obstructive changes are
seen. A 1.9 cm cyst is noted

Abdominal aorta: No aneurysm visualized.

Other findings: None.
IMPRESSION: Left renal cyst.  No acute abnormality noted.

## 2019-12-06 DIAGNOSIS — Z6821 Body mass index (BMI) 21.0-21.9, adult: Secondary | ICD-10-CM | POA: Diagnosis not present

## 2019-12-06 DIAGNOSIS — F1121 Opioid dependence, in remission: Secondary | ICD-10-CM | POA: Diagnosis not present

## 2019-12-06 DIAGNOSIS — F411 Generalized anxiety disorder: Secondary | ICD-10-CM | POA: Diagnosis not present

## 2019-12-06 DIAGNOSIS — Z79891 Long term (current) use of opiate analgesic: Secondary | ICD-10-CM | POA: Diagnosis not present

## 2019-12-18 IMAGING — US US LIVER ELASTOGRAPHY
2 series · 13 of 25 positions shown · non-contrast
Comparison: 11/12/2017

CLINICAL DATA: Chronic hepatitis-C

EXAM:
US LIVER ELASTOGRAPHY
TECHNIQUE: Ultrasound elastography evaluation of the liver was performed. A
region of interest was placed in the right lobe of the liver.
Following application of a compressive sonographic pulse, shear
waves were detected in the adjacent hepatic tissue and the shear
wave velocity was calculated. Multiple assessments were performed at
the selected site. Median shear wave velocity is correlated to a
Metavir fibrosis score.

[Series 1: us liver elastography · 5 of 10 slices shown (1 of 2)]
[im 1/10]
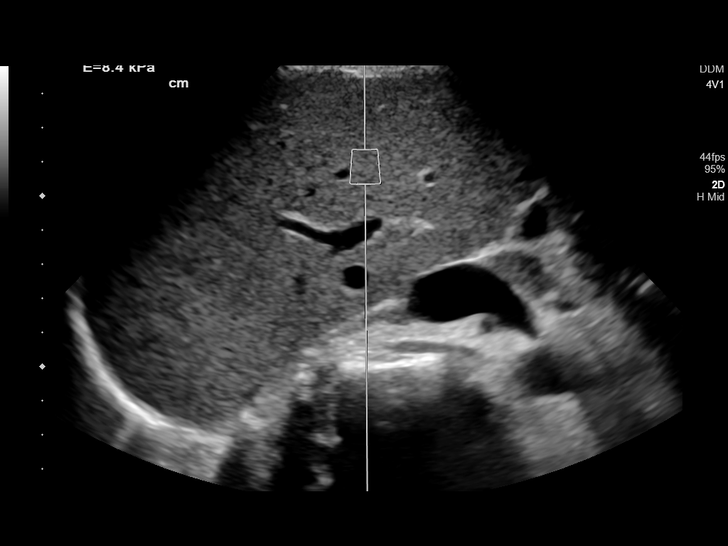
[im 3/10]
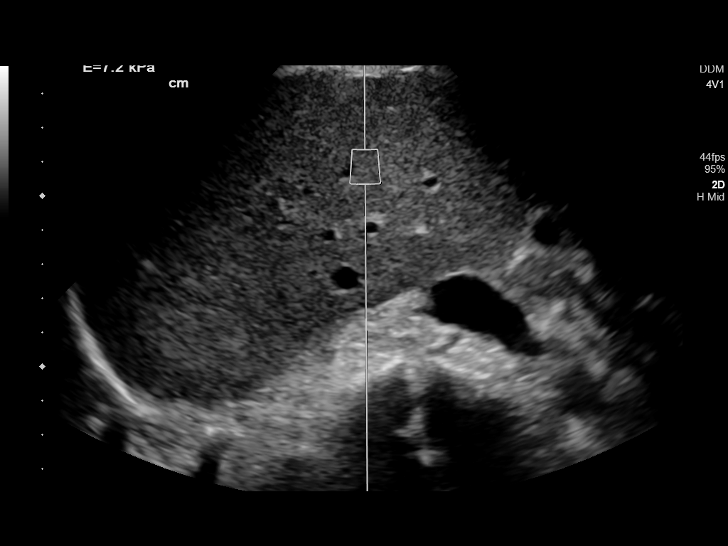
[im 5/10]
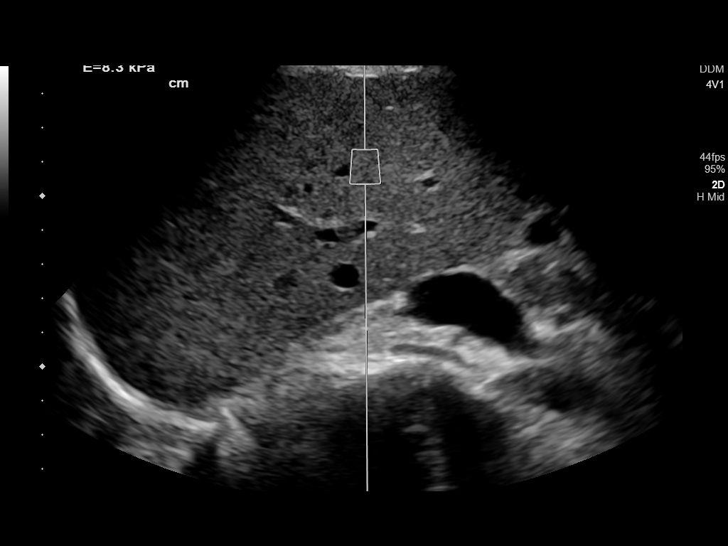
[im 7/10]
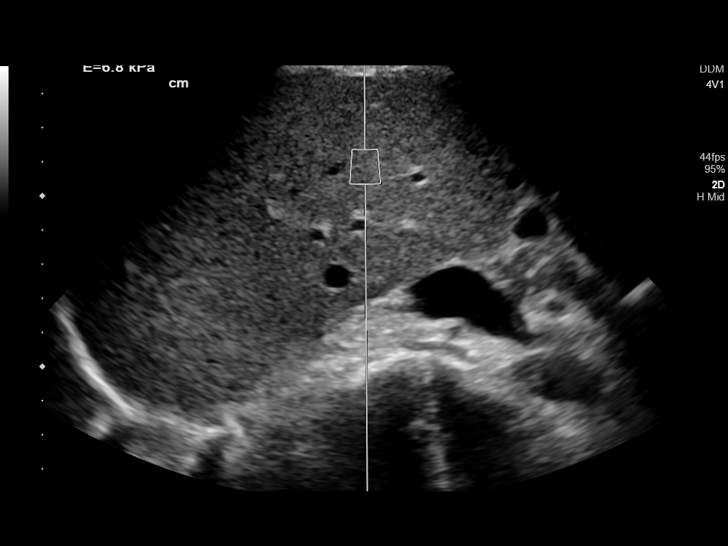
[im 10/10]
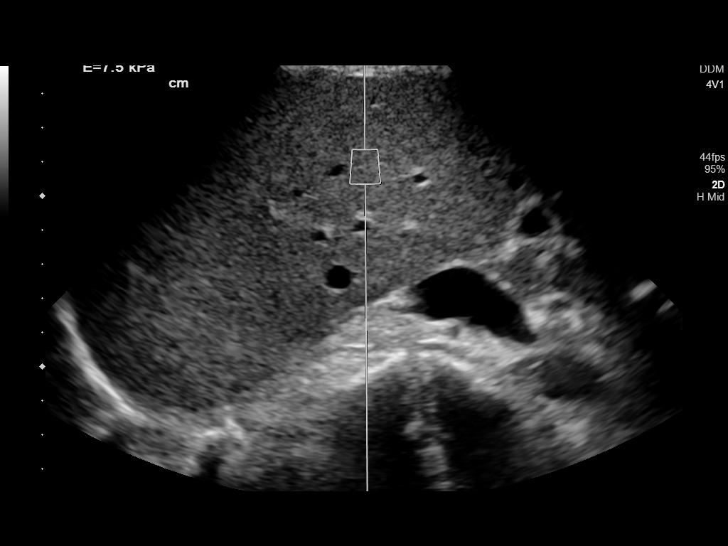

[Series 1001: us liver elastography · 8 of 17 slices shown (2 of 2)]
[im 2/17]
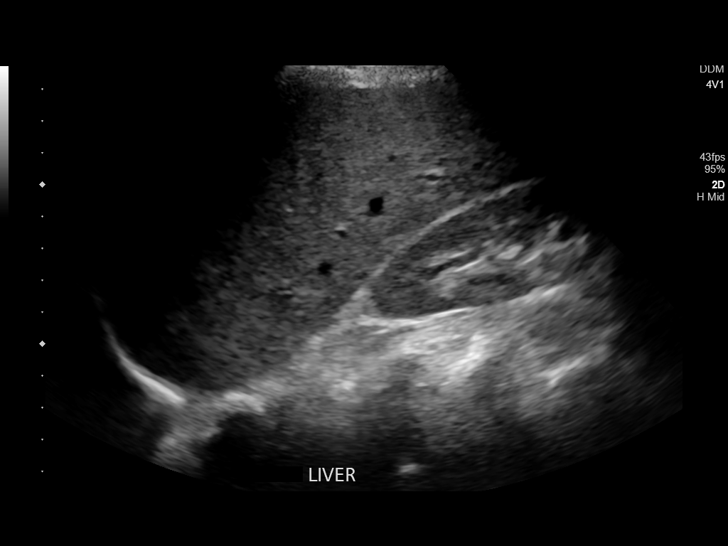
[im 4/17]
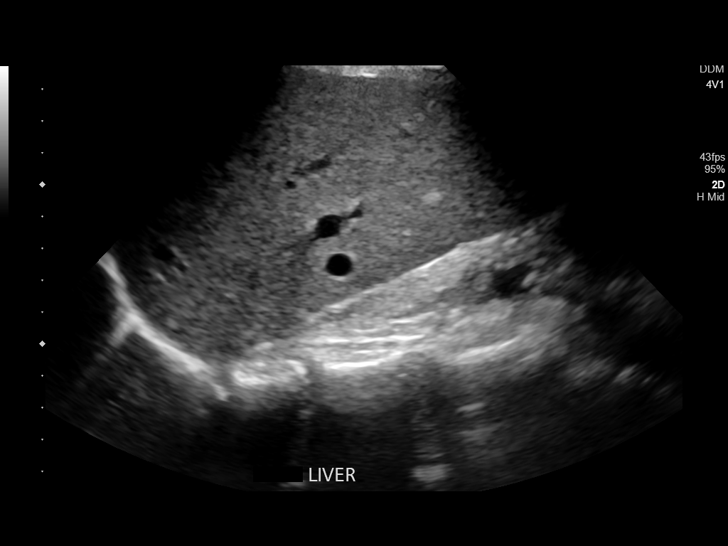
[im 6/17]
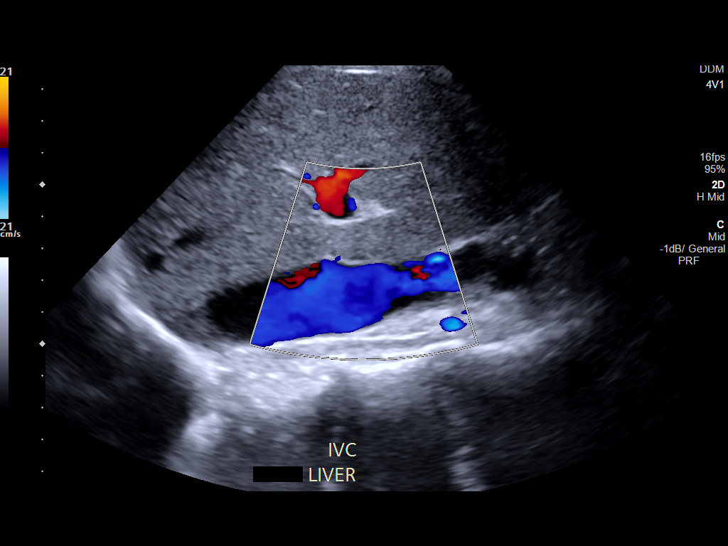
[im 8/17]
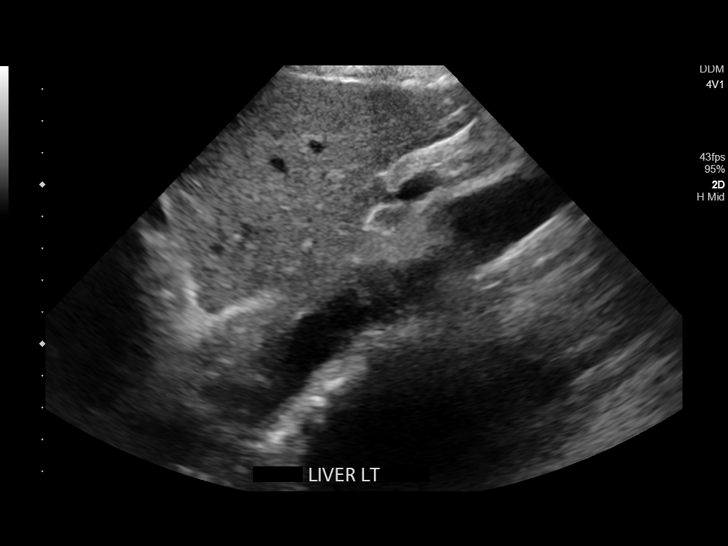
[im 10/17]
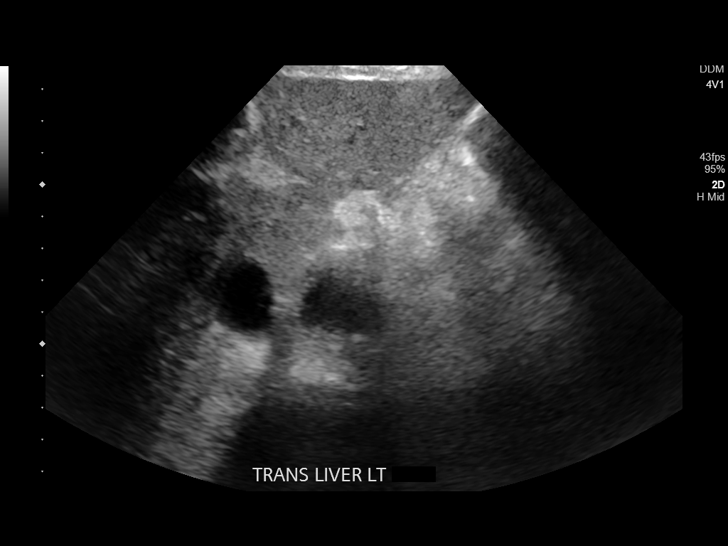
[im 12/17]
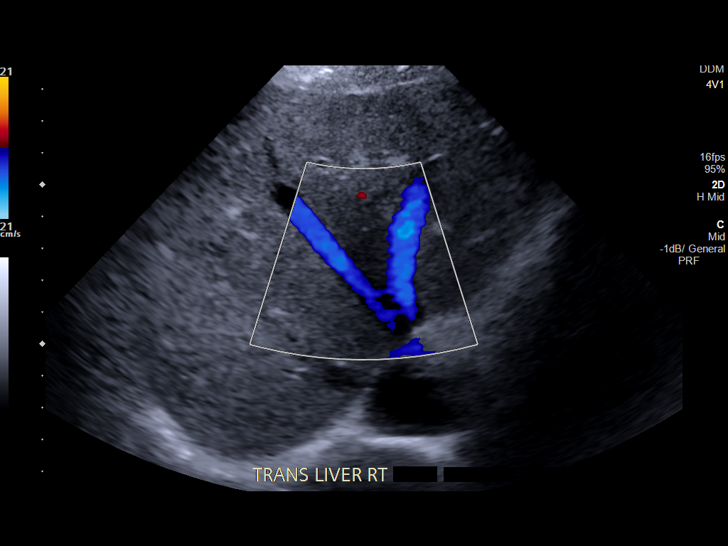
[im 14/17]
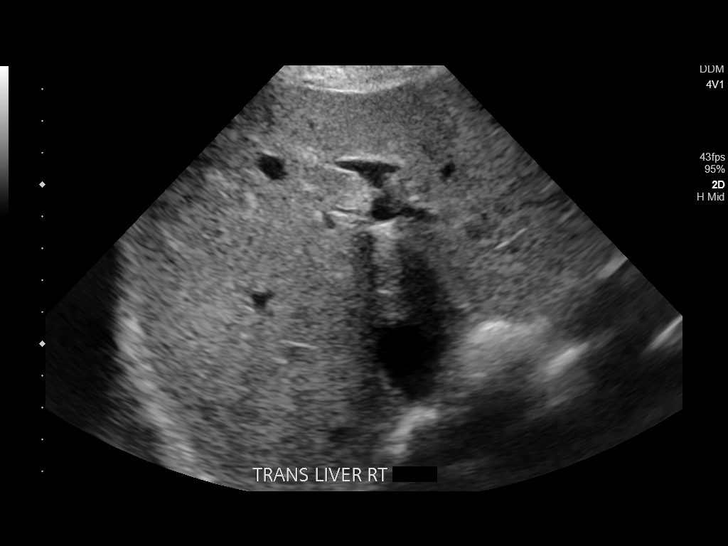
[im 17/17]
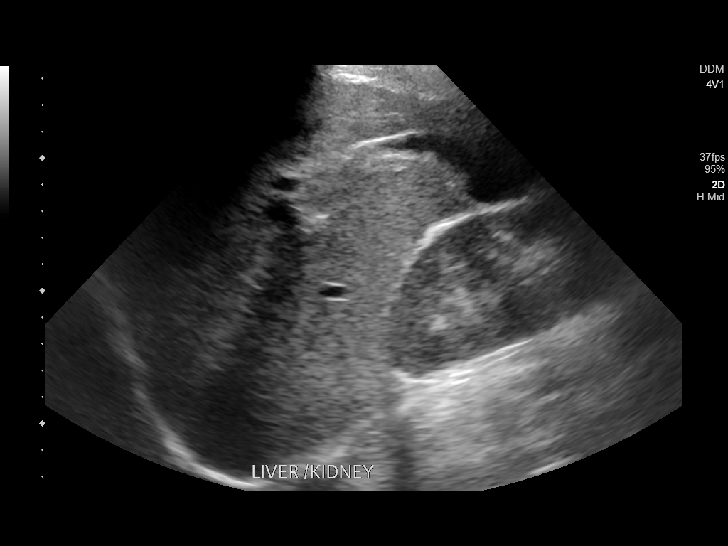

[13 of 25 positions shown; findings below may reference images not displayed]

FINDINGS: Liver: No focal lesion identified. Within normal limits in
parenchymal echogenicity. Portal vein is patent on color Doppler
imaging with normal direction of blood flow towards the liver.

ULTRASOUND HEPATIC ELASTOGRAPHY

Device: Siemens Helix VTQ

Patient position: Supine

Transducer: [id number]

Number of measurements: 10

Hepatic segment:  8

Median velocity:   1.58 m/sec

IQR:

IQR/Median velocity ratio:

Corresponding Metavir fibrosis score:  F2 + some F3

Risk of fibrosis: Moderate

Limitations of exam: None

Please note that abnormal shear wave velocities may also be
identified in clinical settings other than with hepatic fibrosis,
such as: acute hepatitis, elevated right heart and central venous
pressures including use of beta blockers, Cdk disease
(Yadiy), infiltrative processes such as
mastocytosis/amyloidosis/infiltrative tumor, extrahepatic
cholestasis, in the post-prandial state, and liver transplantation.
Correlation with patient history, laboratory data, and clinical
condition recommended.
IMPRESSION: Liver: No acute findings or focal abnormality.

Elastography: Median hepatic shear wave velocity is calculated at
1.58 m/sec.

Corresponding Metavir fibrosis score is F2 + some F3.

Risk of fibrosis is Moderate.

Follow-up: Additional testing appropriate

## 2019-12-20 DIAGNOSIS — F1121 Opioid dependence, in remission: Secondary | ICD-10-CM | POA: Diagnosis not present

## 2019-12-20 DIAGNOSIS — Z79891 Long term (current) use of opiate analgesic: Secondary | ICD-10-CM | POA: Diagnosis not present

## 2019-12-20 DIAGNOSIS — F411 Generalized anxiety disorder: Secondary | ICD-10-CM | POA: Diagnosis not present

## 2019-12-31 NOTE — Progress Notes (Deleted)
Cardiology Office Note:    Date:  12/31/2019   ID:  Regina House, DOB 12/16/1970, MRN [id number]  PCP:  Eunice Blase, PA-C  Cardiologist:  Norman Herrlich, MD    Referring MD: Eunice Blase, PA-C    ASSESSMENT:    No diagnosis found. PLAN:    In order of problems listed above:  1. ***   Next appointment: ***   Medication Adjustments/Labs and Tests Ordered: Current medicines are reviewed at length with the patient today.  Concerns regarding medicines are outlined above.  No orders of the defined types were placed in this encounter.  No orders of the defined types were placed in this encounter.   No chief complaint on file.   History of Present Illness:    Regina House is a 49 y.o. female with a hx of hypertension and palpitation last seen 10/17/2019. Compliance with diet, lifestyle and medications: ***  Echo 11/08/2019: normal 1. Left ventricular ejection fraction, by estimation, is 60 to 65%. The  left ventricle has normal function. The left ventricle has no regional  wall motion abnormalities. There is mild concentric left ventricular  hypertrophy. Left ventricular diastolic  parameters are consistent with Grade II diastolic dysfunction  (pseudonormalization).  2. Right ventricular systolic function is normal. The right ventricular  size is normal. There is normal pulmonary artery systolic pressure.  3. The mitral valve is normal in structure. No evidence of mitral valve  regurgitation. No evidence of mitral stenosis.  4. The aortic valve is normal in structure. Aortic valve regurgitation is  not visualized. No aortic stenosis is present.  5. The inferior vena cava is normal in size with greater than 50%  respiratory variability, suggesting right atrial pressure of 3 mmHg.  Zio 10/17/2019: ZIO monitor was performed for 7 days beginning 10/17/2019 to assess palpitation. Cardiac rhythm throughout was sinus with average minimum and maximum heart rates of 68,  40 and 127 bpm. There were no pauses of 3 seconds or greater and no episodes of second or third-degree AV block or sinus node exit block. Ventricular ectopy was rare with isolated PVCs and couplets. Supraventricular arrhythmia was rare with no episodes of atrial fibrillation or flutter.  2 brief runs of APCs were present for 7 complexes rate 119 bpm. There were 2 triggered and 1 diary event.  These episodes showed sinus rhythm, sinus bradycardia 49 bpm and an episode with headache nausea back shoulder chest pain and tingling in the arm showed sinus rhythm with 1 APC. Conclusion unremarkable monitor with rare ventricular and supraventricular ectopy  Past Medical History:  Diagnosis Date  . Anxiety   . Arthritis   . BMI 22.0-22.9, adult   . Chronic use of opiate drug for therapeutic purpose   . History of tobacco use   . Hypertension   . Insomnia   . Palpitations   . Polysubstance abuse (HCC)   . Seizure (HCC)   . Severe opioid dependence in sustained remission on maintenance therapy (HCC)   . Substance-induced anxiety disorder [facility])     Past Surgical History:  Procedure Laterality Date  . CESAREAN SECTION  1999  . TUBAL LIGATION      Current Medications: No outpatient medications have been marked as taking for the 01/01/20 encounter (Appointment) with Baldo Daub, MD.     Allergies:   Ace inhibitors, Angiotensin receptor blockers, Losartan, and Sulfa antibiotics   Social History   Socioeconomic History  . Marital status: Married    Spouse name:  Not on file  . Number of children: Not on file  . Years of education: Not on file  . Highest education level: Not on file  Occupational History  . Not on file  Tobacco Use  . Smoking status: Current Every Day Smoker  . Smokeless tobacco: Current User  Substance and Sexual Activity  . Alcohol use: No  . Drug use: Not on file  . Sexual activity: Not on file  Other Topics Concern  . Not on file  Social History Narrative  .  Not on file   Social Determinants of Health   Financial Resource Strain:   . Difficulty of Paying Living Expenses: Not on file  Food Insecurity:   . Worried About Programme researcher, broadcasting/film/video in the Last Year: Not on file  . Ran Out of Food in the Last Year: Not on file  Transportation Needs:   . Lack of Transportation (Medical): Not on file  . Lack of Transportation (Non-Medical): Not on file  Physical Activity:   . Days of Exercise per Week: Not on file  . Minutes of Exercise per Session: Not on file  Stress:   . Feeling of Stress : Not on file  Social Connections:   . Frequency of Communication with Friends and Family: Not on file  . Frequency of Social Gatherings with Friends and Family: Not on file  . Attends Religious Services: Not on file  . Active Member of Clubs or Organizations: Not on file  . Attends Banker Meetings: Not on file  . Marital Status: Not on file     Family History: The patient's ***family history includes Arthritis in her maternal grandfather, maternal grandmother, and mother; Heart attack in her father and paternal grandfather; Heart disease in her father and maternal grandmother; Hyperlipidemia in her father and maternal grandfather; Hypertension in her father, maternal grandfather, maternal grandmother, mother, paternal grandfather, and paternal grandmother; Liver cancer in her maternal grandfather; Stroke in her maternal grandmother, paternal grandfather, and paternal grandmother. ROS:   Please see the history of present illness.    All other systems reviewed and are negative.  EKGs/Labs/Other Studies Reviewed:    The following studies were reviewed today:  EKG:  EKG ordered today and personally reviewed.  The ekg ordered today demonstrates ***  Recent Labs: 10/17/2019: Hemoglobin 14.7; Platelets 143; TSH 2.080  Recent Lipid Panel    Component Value Date/Time   CHOL 172 10/17/2019 1047   TRIG 81 10/17/2019 1047   HDL 43 10/17/2019 1047    CHOLHDL 4.0 10/17/2019 1047   LDLCALC 114 (H) 10/17/2019 1047    Physical Exam:    VS:  There were no vitals taken for this visit.    Wt Readings from Last 3 Encounters:  11/14/19 128 lb 6.4 oz (58.2 kg)  10/17/19 130 lb 12.8 oz (59.3 kg)     GEN: *** Well nourished, well developed in no acute distress HEENT: Normal NECK: No JVD; No carotid bruits LYMPHATICS: No lymphadenopathy CARDIAC: ***RRR, no murmurs, rubs, gallops RESPIRATORY:  Clear to auscultation without rales, wheezing or rhonchi  ABDOMEN: Soft, non-tender, non-distended MUSCULOSKELETAL:  No edema; No deformity  SKIN: Warm and dry NEUROLOGIC:  Alert and oriented x 3 PSYCHIATRIC:  Normal affect    Signed, Norman Herrlich, MD  12/31/2019 9:56 AM    [facility]

## 2020-01-01 ENCOUNTER — Ambulatory Visit: Payer: BC Managed Care – PPO | Admitting: Cardiology

## 2020-01-03 DIAGNOSIS — F411 Generalized anxiety disorder: Secondary | ICD-10-CM | POA: Diagnosis not present

## 2020-01-03 DIAGNOSIS — Z79891 Long term (current) use of opiate analgesic: Secondary | ICD-10-CM | POA: Diagnosis not present

## 2020-01-03 DIAGNOSIS — F1121 Opioid dependence, in remission: Secondary | ICD-10-CM | POA: Diagnosis not present

## 2020-01-03 DIAGNOSIS — Z6821 Body mass index (BMI) 21.0-21.9, adult: Secondary | ICD-10-CM | POA: Diagnosis not present

## 2020-01-17 DIAGNOSIS — F411 Generalized anxiety disorder: Secondary | ICD-10-CM | POA: Diagnosis not present

## 2020-01-17 DIAGNOSIS — F1121 Opioid dependence, in remission: Secondary | ICD-10-CM | POA: Diagnosis not present

## 2020-01-17 DIAGNOSIS — Z79891 Long term (current) use of opiate analgesic: Secondary | ICD-10-CM | POA: Diagnosis not present

## 2020-01-31 DIAGNOSIS — F1121 Opioid dependence, in remission: Secondary | ICD-10-CM | POA: Diagnosis not present

## 2020-01-31 DIAGNOSIS — Z6821 Body mass index (BMI) 21.0-21.9, adult: Secondary | ICD-10-CM | POA: Diagnosis not present

## 2020-01-31 DIAGNOSIS — Z79891 Long term (current) use of opiate analgesic: Secondary | ICD-10-CM | POA: Diagnosis not present

## 2020-01-31 DIAGNOSIS — F411 Generalized anxiety disorder: Secondary | ICD-10-CM | POA: Diagnosis not present

## 2020-02-14 DIAGNOSIS — Z79891 Long term (current) use of opiate analgesic: Secondary | ICD-10-CM | POA: Diagnosis not present

## 2020-02-14 DIAGNOSIS — F1121 Opioid dependence, in remission: Secondary | ICD-10-CM | POA: Diagnosis not present

## 2020-02-14 DIAGNOSIS — F411 Generalized anxiety disorder: Secondary | ICD-10-CM | POA: Diagnosis not present

## 2020-02-28 DIAGNOSIS — F411 Generalized anxiety disorder: Secondary | ICD-10-CM | POA: Diagnosis not present

## 2020-02-28 DIAGNOSIS — F1121 Opioid dependence, in remission: Secondary | ICD-10-CM | POA: Diagnosis not present

## 2020-02-28 DIAGNOSIS — Z6821 Body mass index (BMI) 21.0-21.9, adult: Secondary | ICD-10-CM | POA: Diagnosis not present

## 2020-02-28 DIAGNOSIS — Z79891 Long term (current) use of opiate analgesic: Secondary | ICD-10-CM | POA: Diagnosis not present

## 2020-03-13 DIAGNOSIS — F411 Generalized anxiety disorder: Secondary | ICD-10-CM | POA: Diagnosis not present

## 2020-03-13 DIAGNOSIS — Z79891 Long term (current) use of opiate analgesic: Secondary | ICD-10-CM | POA: Diagnosis not present

## 2020-03-13 DIAGNOSIS — F1121 Opioid dependence, in remission: Secondary | ICD-10-CM | POA: Diagnosis not present

## 2020-03-27 DIAGNOSIS — F1121 Opioid dependence, in remission: Secondary | ICD-10-CM | POA: Diagnosis not present

## 2020-03-27 DIAGNOSIS — F411 Generalized anxiety disorder: Secondary | ICD-10-CM | POA: Diagnosis not present

## 2020-03-27 DIAGNOSIS — Z79891 Long term (current) use of opiate analgesic: Secondary | ICD-10-CM | POA: Diagnosis not present

## 2020-04-10 DIAGNOSIS — F411 Generalized anxiety disorder: Secondary | ICD-10-CM | POA: Diagnosis not present

## 2020-04-10 DIAGNOSIS — Z6821 Body mass index (BMI) 21.0-21.9, adult: Secondary | ICD-10-CM | POA: Diagnosis not present

## 2020-04-10 DIAGNOSIS — F1121 Opioid dependence, in remission: Secondary | ICD-10-CM | POA: Diagnosis not present

## 2020-04-10 DIAGNOSIS — Z79891 Long term (current) use of opiate analgesic: Secondary | ICD-10-CM | POA: Diagnosis not present

## 2020-04-28 DIAGNOSIS — J019 Acute sinusitis, unspecified: Secondary | ICD-10-CM | POA: Diagnosis not present

## 2020-05-01 DIAGNOSIS — Z79891 Long term (current) use of opiate analgesic: Secondary | ICD-10-CM | POA: Diagnosis not present

## 2020-05-01 DIAGNOSIS — F1121 Opioid dependence, in remission: Secondary | ICD-10-CM | POA: Diagnosis not present

## 2020-05-01 DIAGNOSIS — F411 Generalized anxiety disorder: Secondary | ICD-10-CM | POA: Diagnosis not present

## 2020-05-15 DIAGNOSIS — F411 Generalized anxiety disorder: Secondary | ICD-10-CM | POA: Diagnosis not present

## 2020-05-15 DIAGNOSIS — F1121 Opioid dependence, in remission: Secondary | ICD-10-CM | POA: Diagnosis not present

## 2020-05-15 DIAGNOSIS — Z6821 Body mass index (BMI) 21.0-21.9, adult: Secondary | ICD-10-CM | POA: Diagnosis not present

## 2020-05-15 DIAGNOSIS — Z79891 Long term (current) use of opiate analgesic: Secondary | ICD-10-CM | POA: Diagnosis not present

## 2020-05-29 DIAGNOSIS — F1121 Opioid dependence, in remission: Secondary | ICD-10-CM | POA: Diagnosis not present

## 2020-05-29 DIAGNOSIS — Z79891 Long term (current) use of opiate analgesic: Secondary | ICD-10-CM | POA: Diagnosis not present

## 2020-05-29 DIAGNOSIS — F411 Generalized anxiety disorder: Secondary | ICD-10-CM | POA: Diagnosis not present

## 2020-05-29 DIAGNOSIS — Z87891 Personal history of nicotine dependence: Secondary | ICD-10-CM | POA: Diagnosis not present

## 2020-06-13 DIAGNOSIS — F411 Generalized anxiety disorder: Secondary | ICD-10-CM | POA: Diagnosis not present

## 2020-06-13 DIAGNOSIS — F1121 Opioid dependence, in remission: Secondary | ICD-10-CM | POA: Diagnosis not present

## 2020-06-13 DIAGNOSIS — Z6820 Body mass index (BMI) 20.0-20.9, adult: Secondary | ICD-10-CM | POA: Diagnosis not present

## 2020-06-27 DIAGNOSIS — F411 Generalized anxiety disorder: Secondary | ICD-10-CM | POA: Diagnosis not present

## 2020-06-27 DIAGNOSIS — F1121 Opioid dependence, in remission: Secondary | ICD-10-CM | POA: Diagnosis not present

## 2020-07-11 DIAGNOSIS — Z79891 Long term (current) use of opiate analgesic: Secondary | ICD-10-CM | POA: Diagnosis not present

## 2020-07-11 DIAGNOSIS — F1121 Opioid dependence, in remission: Secondary | ICD-10-CM | POA: Diagnosis not present

## 2020-07-11 DIAGNOSIS — Z681 Body mass index (BMI) 19 or less, adult: Secondary | ICD-10-CM | POA: Diagnosis not present

## 2020-07-11 DIAGNOSIS — F411 Generalized anxiety disorder: Secondary | ICD-10-CM | POA: Diagnosis not present

## 2020-07-25 DIAGNOSIS — F1121 Opioid dependence, in remission: Secondary | ICD-10-CM | POA: Diagnosis not present

## 2020-07-25 DIAGNOSIS — F411 Generalized anxiety disorder: Secondary | ICD-10-CM | POA: Diagnosis not present

## 2020-07-25 DIAGNOSIS — Z79891 Long term (current) use of opiate analgesic: Secondary | ICD-10-CM | POA: Diagnosis not present

## 2020-08-08 DIAGNOSIS — Z681 Body mass index (BMI) 19 or less, adult: Secondary | ICD-10-CM | POA: Diagnosis not present

## 2020-08-08 DIAGNOSIS — Z79891 Long term (current) use of opiate analgesic: Secondary | ICD-10-CM | POA: Diagnosis not present

## 2020-08-08 DIAGNOSIS — F1121 Opioid dependence, in remission: Secondary | ICD-10-CM | POA: Diagnosis not present

## 2020-08-08 DIAGNOSIS — F411 Generalized anxiety disorder: Secondary | ICD-10-CM | POA: Diagnosis not present

## 2020-08-22 DIAGNOSIS — Z79891 Long term (current) use of opiate analgesic: Secondary | ICD-10-CM | POA: Diagnosis not present

## 2020-08-22 DIAGNOSIS — F411 Generalized anxiety disorder: Secondary | ICD-10-CM | POA: Diagnosis not present

## 2020-08-22 DIAGNOSIS — F1121 Opioid dependence, in remission: Secondary | ICD-10-CM | POA: Diagnosis not present

## 2020-09-05 DIAGNOSIS — F1121 Opioid dependence, in remission: Secondary | ICD-10-CM | POA: Diagnosis not present

## 2020-09-05 DIAGNOSIS — F411 Generalized anxiety disorder: Secondary | ICD-10-CM | POA: Diagnosis not present

## 2020-09-05 DIAGNOSIS — Z681 Body mass index (BMI) 19 or less, adult: Secondary | ICD-10-CM | POA: Diagnosis not present

## 2020-09-05 DIAGNOSIS — Z79891 Long term (current) use of opiate analgesic: Secondary | ICD-10-CM | POA: Diagnosis not present

## 2020-09-19 DIAGNOSIS — F411 Generalized anxiety disorder: Secondary | ICD-10-CM | POA: Diagnosis not present

## 2020-09-19 DIAGNOSIS — Z79891 Long term (current) use of opiate analgesic: Secondary | ICD-10-CM | POA: Diagnosis not present

## 2020-09-19 DIAGNOSIS — F1121 Opioid dependence, in remission: Secondary | ICD-10-CM | POA: Diagnosis not present

## 2020-10-17 DIAGNOSIS — F1120 Opioid dependence, uncomplicated: Secondary | ICD-10-CM | POA: Diagnosis not present

## 2020-10-17 DIAGNOSIS — Z79891 Long term (current) use of opiate analgesic: Secondary | ICD-10-CM | POA: Diagnosis not present

## 2020-10-17 DIAGNOSIS — F411 Generalized anxiety disorder: Secondary | ICD-10-CM | POA: Diagnosis not present

## 2020-10-17 DIAGNOSIS — Z681 Body mass index (BMI) 19 or less, adult: Secondary | ICD-10-CM | POA: Diagnosis not present

## 2020-10-31 DIAGNOSIS — F1120 Opioid dependence, uncomplicated: Secondary | ICD-10-CM | POA: Diagnosis not present

## 2020-10-31 DIAGNOSIS — Z79891 Long term (current) use of opiate analgesic: Secondary | ICD-10-CM | POA: Diagnosis not present

## 2020-10-31 DIAGNOSIS — F411 Generalized anxiety disorder: Secondary | ICD-10-CM | POA: Diagnosis not present

## 2020-11-14 DIAGNOSIS — F1120 Opioid dependence, uncomplicated: Secondary | ICD-10-CM | POA: Diagnosis not present

## 2020-11-14 DIAGNOSIS — F411 Generalized anxiety disorder: Secondary | ICD-10-CM | POA: Diagnosis not present

## 2020-11-14 DIAGNOSIS — Z681 Body mass index (BMI) 19 or less, adult: Secondary | ICD-10-CM | POA: Diagnosis not present

## 2020-11-14 DIAGNOSIS — Z79891 Long term (current) use of opiate analgesic: Secondary | ICD-10-CM | POA: Diagnosis not present

## 2020-12-25 DIAGNOSIS — Z681 Body mass index (BMI) 19 or less, adult: Secondary | ICD-10-CM | POA: Diagnosis not present

## 2020-12-25 DIAGNOSIS — Z79891 Long term (current) use of opiate analgesic: Secondary | ICD-10-CM | POA: Diagnosis not present

## 2020-12-25 DIAGNOSIS — F1120 Opioid dependence, uncomplicated: Secondary | ICD-10-CM | POA: Diagnosis not present

## 2020-12-25 DIAGNOSIS — F411 Generalized anxiety disorder: Secondary | ICD-10-CM | POA: Diagnosis not present

## 2021-01-08 DIAGNOSIS — Z79891 Long term (current) use of opiate analgesic: Secondary | ICD-10-CM | POA: Diagnosis not present

## 2021-01-08 DIAGNOSIS — Z681 Body mass index (BMI) 19 or less, adult: Secondary | ICD-10-CM | POA: Diagnosis not present

## 2021-01-08 DIAGNOSIS — F1120 Opioid dependence, uncomplicated: Secondary | ICD-10-CM | POA: Diagnosis not present

## 2021-01-08 DIAGNOSIS — F411 Generalized anxiety disorder: Secondary | ICD-10-CM | POA: Diagnosis not present

## 2021-01-22 DIAGNOSIS — F1120 Opioid dependence, uncomplicated: Secondary | ICD-10-CM | POA: Diagnosis not present

## 2021-01-22 DIAGNOSIS — Z79891 Long term (current) use of opiate analgesic: Secondary | ICD-10-CM | POA: Diagnosis not present

## 2021-01-22 DIAGNOSIS — F411 Generalized anxiety disorder: Secondary | ICD-10-CM | POA: Diagnosis not present

## 2021-01-22 DIAGNOSIS — Z681 Body mass index (BMI) 19 or less, adult: Secondary | ICD-10-CM | POA: Diagnosis not present

## 2021-02-05 DIAGNOSIS — F1120 Opioid dependence, uncomplicated: Secondary | ICD-10-CM | POA: Diagnosis not present

## 2021-02-05 DIAGNOSIS — F411 Generalized anxiety disorder: Secondary | ICD-10-CM | POA: Diagnosis not present

## 2021-02-05 DIAGNOSIS — Z681 Body mass index (BMI) 19 or less, adult: Secondary | ICD-10-CM | POA: Diagnosis not present

## 2021-02-20 DIAGNOSIS — F411 Generalized anxiety disorder: Secondary | ICD-10-CM | POA: Diagnosis not present

## 2021-02-20 DIAGNOSIS — Z6821 Body mass index (BMI) 21.0-21.9, adult: Secondary | ICD-10-CM | POA: Diagnosis not present

## 2021-02-20 DIAGNOSIS — Z79891 Long term (current) use of opiate analgesic: Secondary | ICD-10-CM | POA: Diagnosis not present

## 2021-02-20 DIAGNOSIS — F1120 Opioid dependence, uncomplicated: Secondary | ICD-10-CM | POA: Diagnosis not present

## 2021-03-06 DIAGNOSIS — Z79891 Long term (current) use of opiate analgesic: Secondary | ICD-10-CM | POA: Diagnosis not present

## 2021-03-06 DIAGNOSIS — F411 Generalized anxiety disorder: Secondary | ICD-10-CM | POA: Diagnosis not present

## 2021-03-06 DIAGNOSIS — F1120 Opioid dependence, uncomplicated: Secondary | ICD-10-CM | POA: Diagnosis not present

## 2021-03-06 DIAGNOSIS — Z6821 Body mass index (BMI) 21.0-21.9, adult: Secondary | ICD-10-CM | POA: Diagnosis not present

## 2021-03-28 DIAGNOSIS — F411 Generalized anxiety disorder: Secondary | ICD-10-CM | POA: Diagnosis not present

## 2021-03-28 DIAGNOSIS — F1120 Opioid dependence, uncomplicated: Secondary | ICD-10-CM | POA: Diagnosis not present

## 2021-03-28 DIAGNOSIS — Z79891 Long term (current) use of opiate analgesic: Secondary | ICD-10-CM | POA: Diagnosis not present

## 2021-03-28 DIAGNOSIS — Z6821 Body mass index (BMI) 21.0-21.9, adult: Secondary | ICD-10-CM | POA: Diagnosis not present

## 2021-04-18 DIAGNOSIS — Z6821 Body mass index (BMI) 21.0-21.9, adult: Secondary | ICD-10-CM | POA: Diagnosis not present

## 2021-04-18 DIAGNOSIS — F1120 Opioid dependence, uncomplicated: Secondary | ICD-10-CM | POA: Diagnosis not present

## 2021-04-18 DIAGNOSIS — F411 Generalized anxiety disorder: Secondary | ICD-10-CM | POA: Diagnosis not present

## 2021-04-18 DIAGNOSIS — Z79891 Long term (current) use of opiate analgesic: Secondary | ICD-10-CM | POA: Diagnosis not present

## 2021-05-15 DIAGNOSIS — Z79891 Long term (current) use of opiate analgesic: Secondary | ICD-10-CM | POA: Diagnosis not present

## 2021-05-15 DIAGNOSIS — F1120 Opioid dependence, uncomplicated: Secondary | ICD-10-CM | POA: Diagnosis not present

## 2021-05-15 DIAGNOSIS — F411 Generalized anxiety disorder: Secondary | ICD-10-CM | POA: Diagnosis not present

## 2021-05-15 DIAGNOSIS — Z6820 Body mass index (BMI) 20.0-20.9, adult: Secondary | ICD-10-CM | POA: Diagnosis not present

## 2021-06-05 DIAGNOSIS — Z6821 Body mass index (BMI) 21.0-21.9, adult: Secondary | ICD-10-CM | POA: Diagnosis not present

## 2021-06-05 DIAGNOSIS — F1120 Opioid dependence, uncomplicated: Secondary | ICD-10-CM | POA: Diagnosis not present

## 2021-06-05 DIAGNOSIS — F411 Generalized anxiety disorder: Secondary | ICD-10-CM | POA: Diagnosis not present

## 2021-06-26 DIAGNOSIS — Z6820 Body mass index (BMI) 20.0-20.9, adult: Secondary | ICD-10-CM | POA: Diagnosis not present

## 2021-06-26 DIAGNOSIS — F411 Generalized anxiety disorder: Secondary | ICD-10-CM | POA: Diagnosis not present

## 2021-06-26 DIAGNOSIS — F1120 Opioid dependence, uncomplicated: Secondary | ICD-10-CM | POA: Diagnosis not present

## 2021-06-26 DIAGNOSIS — Z79891 Long term (current) use of opiate analgesic: Secondary | ICD-10-CM | POA: Diagnosis not present

## 2021-07-17 DIAGNOSIS — F1120 Opioid dependence, uncomplicated: Secondary | ICD-10-CM | POA: Diagnosis not present

## 2021-07-17 DIAGNOSIS — F411 Generalized anxiety disorder: Secondary | ICD-10-CM | POA: Diagnosis not present

## 2021-07-17 DIAGNOSIS — Z6821 Body mass index (BMI) 21.0-21.9, adult: Secondary | ICD-10-CM | POA: Diagnosis not present

## 2021-08-07 DIAGNOSIS — Z79899 Other long term (current) drug therapy: Secondary | ICD-10-CM | POA: Diagnosis not present

## 2021-08-07 DIAGNOSIS — F411 Generalized anxiety disorder: Secondary | ICD-10-CM | POA: Diagnosis not present

## 2021-08-07 DIAGNOSIS — Z6820 Body mass index (BMI) 20.0-20.9, adult: Secondary | ICD-10-CM | POA: Diagnosis not present

## 2021-08-07 DIAGNOSIS — F1120 Opioid dependence, uncomplicated: Secondary | ICD-10-CM | POA: Diagnosis not present
